# Patient Record
Sex: Female | Born: 1955 | Race: Black or African American | Hispanic: No | Marital: Married | Smoking: Never smoker
Health system: Southern US, Community
[De-identification: ages and names within clinical notes are randomized; demographics above are authoritative.]

## PROBLEM LIST (undated history)

## (undated) DIAGNOSIS — I10 Essential (primary) hypertension: Secondary | ICD-10-CM

## (undated) DIAGNOSIS — J189 Pneumonia, unspecified organism: Secondary | ICD-10-CM

## (undated) DIAGNOSIS — I639 Cerebral infarction, unspecified: Secondary | ICD-10-CM

## (undated) DIAGNOSIS — J45909 Unspecified asthma, uncomplicated: Secondary | ICD-10-CM

## (undated) DIAGNOSIS — C801 Malignant (primary) neoplasm, unspecified: Secondary | ICD-10-CM

## (undated) DIAGNOSIS — J42 Unspecified chronic bronchitis: Secondary | ICD-10-CM

## (undated) DIAGNOSIS — E119 Type 2 diabetes mellitus without complications: Secondary | ICD-10-CM

## (undated) DIAGNOSIS — B20 Human immunodeficiency virus [HIV] disease: Secondary | ICD-10-CM

## (undated) DIAGNOSIS — D649 Anemia, unspecified: Secondary | ICD-10-CM

## (undated) DIAGNOSIS — R413 Other amnesia: Secondary | ICD-10-CM

## (undated) DIAGNOSIS — M1990 Unspecified osteoarthritis, unspecified site: Secondary | ICD-10-CM

## (undated) DIAGNOSIS — A481 Legionnaires' disease: Secondary | ICD-10-CM

## (undated) DIAGNOSIS — J4489 Other specified chronic obstructive pulmonary disease: Secondary | ICD-10-CM

## (undated) DIAGNOSIS — Z21 Asymptomatic human immunodeficiency virus [HIV] infection status: Secondary | ICD-10-CM

## (undated) DIAGNOSIS — J449 Chronic obstructive pulmonary disease, unspecified: Secondary | ICD-10-CM

## (undated) HISTORY — PX: VAGINAL HYSTERECTOMY: SUR661

## (undated) HISTORY — PX: TONSILLECTOMY: SUR1361

## (undated) HISTORY — DX: Human immunodeficiency virus (HIV) disease: B20

## (undated) HISTORY — PX: CATARACT EXTRACTION W/ INTRAOCULAR LENS  IMPLANT, BILATERAL: SHX1307

## (undated) HISTORY — DX: Legionnaires' disease: A48.1

## (undated) HISTORY — PX: TUBAL LIGATION: SHX77

## (undated) HISTORY — PX: EYE SURGERY: SHX253

## (undated) HISTORY — DX: Asymptomatic human immunodeficiency virus (hiv) infection status: Z21

---

## 2010-09-17 DIAGNOSIS — G894 Chronic pain syndrome: Secondary | ICD-10-CM | POA: Insufficient documentation

## 2010-09-17 DIAGNOSIS — E119 Type 2 diabetes mellitus without complications: Secondary | ICD-10-CM | POA: Insufficient documentation

## 2010-10-15 DIAGNOSIS — M8580 Other specified disorders of bone density and structure, unspecified site: Secondary | ICD-10-CM | POA: Insufficient documentation

## 2010-10-15 DIAGNOSIS — E559 Vitamin D deficiency, unspecified: Secondary | ICD-10-CM | POA: Insufficient documentation

## 2010-10-30 DIAGNOSIS — D472 Monoclonal gammopathy: Secondary | ICD-10-CM | POA: Insufficient documentation

## 2010-11-24 DIAGNOSIS — M222X9 Patellofemoral disorders, unspecified knee: Secondary | ICD-10-CM | POA: Insufficient documentation

## 2011-05-05 DIAGNOSIS — Z0000 Encounter for general adult medical examination without abnormal findings: Secondary | ICD-10-CM | POA: Insufficient documentation

## 2011-08-16 DIAGNOSIS — M19049 Primary osteoarthritis, unspecified hand: Secondary | ICD-10-CM | POA: Insufficient documentation

## 2012-07-04 DIAGNOSIS — H209 Unspecified iridocyclitis: Secondary | ICD-10-CM | POA: Insufficient documentation

## 2013-07-09 ENCOUNTER — Encounter (HOSPITAL_COMMUNITY): Payer: Self-pay | Admitting: Emergency Medicine

## 2013-07-09 DIAGNOSIS — Z8673 Personal history of transient ischemic attack (TIA), and cerebral infarction without residual deficits: Secondary | ICD-10-CM | POA: Insufficient documentation

## 2013-07-09 DIAGNOSIS — Z8701 Personal history of pneumonia (recurrent): Secondary | ICD-10-CM | POA: Insufficient documentation

## 2013-07-09 DIAGNOSIS — N390 Urinary tract infection, site not specified: Secondary | ICD-10-CM | POA: Insufficient documentation

## 2013-07-09 DIAGNOSIS — E119 Type 2 diabetes mellitus without complications: Secondary | ICD-10-CM | POA: Insufficient documentation

## 2013-07-09 DIAGNOSIS — J45909 Unspecified asthma, uncomplicated: Secondary | ICD-10-CM | POA: Insufficient documentation

## 2013-07-09 DIAGNOSIS — I10 Essential (primary) hypertension: Secondary | ICD-10-CM | POA: Insufficient documentation

## 2013-07-09 LAB — CBC WITH DIFFERENTIAL/PLATELET
Basophils Absolute: 0 10*3/uL (ref 0.0–0.1)
Basophils Relative: 0 % (ref 0–1)
EOS PCT: 2 % (ref 0–5)
Eosinophils Absolute: 0.1 10*3/uL (ref 0.0–0.7)
HEMATOCRIT: 32.5 % — AB (ref 36.0–46.0)
HEMOGLOBIN: 10.5 g/dL — AB (ref 12.0–15.0)
LYMPHS ABS: 1.3 10*3/uL (ref 0.7–4.0)
Lymphocytes Relative: 31 % (ref 12–46)
MCH: 30.7 pg (ref 26.0–34.0)
MCHC: 32.3 g/dL (ref 30.0–36.0)
MCV: 95 fL (ref 78.0–100.0)
MONO ABS: 0.5 10*3/uL (ref 0.1–1.0)
MONOS PCT: 11 % (ref 3–12)
NEUTROS ABS: 2.3 10*3/uL (ref 1.7–7.7)
Neutrophils Relative %: 56 % (ref 43–77)
Platelets: 202 10*3/uL (ref 150–400)
RBC: 3.42 MIL/uL — AB (ref 3.87–5.11)
RDW: 14.1 % (ref 11.5–15.5)
WBC: 4.1 10*3/uL (ref 4.0–10.5)

## 2013-07-09 LAB — URINALYSIS, ROUTINE W REFLEX MICROSCOPIC
BILIRUBIN URINE: NEGATIVE
Glucose, UA: NEGATIVE mg/dL
Ketones, ur: NEGATIVE mg/dL
NITRITE: NEGATIVE
Protein, ur: 100 mg/dL — AB
SPECIFIC GRAVITY, URINE: 1.011 (ref 1.005–1.030)
Urobilinogen, UA: 0.2 mg/dL (ref 0.0–1.0)
pH: 6 (ref 5.0–8.0)

## 2013-07-09 LAB — COMPREHENSIVE METABOLIC PANEL
ALT: 43 U/L — ABNORMAL HIGH (ref 0–35)
AST: 90 U/L — ABNORMAL HIGH (ref 0–37)
Albumin: 3.3 g/dL — ABNORMAL LOW (ref 3.5–5.2)
Alkaline Phosphatase: 54 U/L (ref 39–117)
BILIRUBIN TOTAL: 0.5 mg/dL (ref 0.3–1.2)
BUN: 13 mg/dL (ref 6–23)
CALCIUM: 9 mg/dL (ref 8.4–10.5)
CHLORIDE: 97 meq/L (ref 96–112)
CO2: 21 meq/L (ref 19–32)
CREATININE: 1.14 mg/dL — AB (ref 0.50–1.10)
GFR calc non Af Amer: 52 mL/min — ABNORMAL LOW (ref >90)
GFR, EST AFRICAN AMERICAN: 60 mL/min — AB (ref >90)
GLUCOSE: 118 mg/dL — AB (ref 70–99)
Potassium: 3.7 mEq/L (ref 3.7–5.3)
Sodium: 134 mEq/L — ABNORMAL LOW (ref 137–147)
Total Protein: 8.8 g/dL — ABNORMAL HIGH (ref 6.0–8.3)

## 2013-07-09 LAB — URINE MICROSCOPIC-ADD ON

## 2013-07-09 LAB — CBG MONITORING, ED: GLUCOSE-CAPILLARY: 130 mg/dL — AB (ref 70–99)

## 2013-07-09 NOTE — ED Notes (Signed)
The pt had 1 500mg  tylenol 2000

## 2013-07-09 NOTE — ED Notes (Signed)
The pt is visiting her daughter from [place] she has been there a week.. Today the daughter thought that the pt felt too hot so she checked her temp.  Her temp was elevated.  The pt has no pain and no other complaints.  She was ok all day.  The daughter reports that it is too hot in her house

## 2013-07-10 ENCOUNTER — Emergency Department (HOSPITAL_COMMUNITY)
Admission: EM | Admit: 2013-07-10 | Discharge: 2013-07-10 | Disposition: A | Discharge status: Home / Self Care | Payer: Federal, State, Local not specified - PPO | Attending: Emergency Medicine | Admitting: Emergency Medicine

## 2013-07-10 DIAGNOSIS — N390 Urinary tract infection, site not specified: Secondary | ICD-10-CM

## 2013-07-10 DIAGNOSIS — R509 Fever, unspecified: Secondary | ICD-10-CM

## 2013-07-10 DIAGNOSIS — E119 Type 2 diabetes mellitus without complications: Secondary | ICD-10-CM

## 2013-07-10 HISTORY — DX: Pneumonia, unspecified organism: J18.9

## 2013-07-10 HISTORY — DX: Unspecified asthma, uncomplicated: J45.909

## 2013-07-10 HISTORY — DX: Cerebral infarction, unspecified: I63.9

## 2013-07-10 HISTORY — DX: Essential (primary) hypertension: I10

## 2013-07-10 MED ORDER — CEPHALEXIN 250 MG PO CAPS
500.0000 mg | ORAL_CAPSULE | Freq: Once | ORAL | Status: AC
  Administered 2013-07-10: 500 mg via ORAL
  Filled 2013-07-10: qty 2

## 2013-07-10 MED ORDER — CEPHALEXIN 500 MG PO CAPS: 500.0000 mg | ORAL_CAPSULE | Freq: Three times a day (TID) | ORAL | Status: DC

## 2013-07-10 NOTE — ED Provider Notes (Signed)
CSN: [id number]     Arrival date & time 07/09/13  2146 History   First MD Initiated Contact with Patient 07/10/13 0056     Chief Complaint  Patient presents with  . Fever     (Consider location/radiation/quality/duration/timing/severity/associated sxs/prior Treatment) HPI This patient is a pleasant 58 year old diabetic woman who is visiting her daughter. The patient developed a subjective fever this afternoon. Daughter felt that the patient had a tactile fever. Thus the patient was brought to the emergency department. The patient is feeling better at this time. She did not have any experience of infectious symptoms such as URI symptoms, GI symptoms or GU symptoms.  No recent antibiotics or hospitalizations. He reports that her by mouth intake has been normal.  Past Medical History  Diagnosis Date  . Diabetes mellitus without complication   . Hypertension   . Stroke   . Asthma   . Pneumonia    History reviewed. No pertinent past surgical history. No family history on file. History  Substance Use Topics  . Smoking status: Never Smoker   . Smokeless tobacco: Not on file  . Alcohol Use: No   OB History   Grav Para Term Preterm Abortions TAB SAB Ect Mult Living                 Review of Systems Ten point review of symptoms performed and is negative with the exception of symptoms noted above.     Allergies  Aspirin and Prednisone  Home Medications   Prior to Admission medications   Not on File   BP 105/68  Pulse 112  Temp(Src) 100.3 F (37.9 C) (Oral)  Resp 18  SpO2 98% Physical Exam Gen: well developed and well nourished appearing Head: NCAT Eyes: PERL, EOMI Nose: no epistaixis or rhinorrhea Mouth/throat: mucosa is moist and pink Neck: supple, no stridor Lungs: CTA B, no wheezing, rhonchi or rales CV: pulse 88, RRR, no murmur, extremities appear well perfused.  Abd: soft, notender, nondistended Back: no ttp, no cva ttp Skin: warm and dry Ext: normal to  inspection, no dependent edema Neuro: CN ii-xii grossly intact, no focal deficits Psyche; normal affect,  calm and cooperative.   ED Course  Procedures (including critical care time) Labs Review Labs Reviewed  CBC WITH DIFFERENTIAL - Abnormal; Notable for the following:    RBC 3.42 (*)    Hemoglobin 10.5 (*)    HCT 32.5 (*)    All other components within normal limits  COMPREHENSIVE METABOLIC PANEL - Abnormal; Notable for the following:    Sodium 134 (*)    Glucose, Bld 118 (*)    Creatinine, Ser 1.14 (*)    Total Protein 8.8 (*)    Albumin 3.3 (*)    AST 90 (*)    ALT 43 (*)    GFR calc non Af Amer 52 (*)    GFR calc Af Amer 60 (*)    All other components within normal limits  URINALYSIS, ROUTINE W REFLEX MICROSCOPIC - Abnormal; Notable for the following:    APPearance TURBID (*)    Hgb urine dipstick MODERATE (*)    Protein, ur 100 (*)    Leukocytes, UA LARGE (*)    All other components within normal limits  URINE MICROSCOPIC-ADD ON - Abnormal; Notable for the following:    Bacteria, UA MANY (*)    All other components within normal limits  CBG MONITORING, ED - Abnormal; Notable for the following:    Glucose-Capillary 130 (*)  All other components within normal limits  URINE CULTURE      MDM   DDX: UTI, pneumonia, early URI  Patient with simple UTI. She is nontoxic and well hydrated appearing. Stable for d/c with empiric abx. First dose in ED.     Elyn Peers, MD 07/10/13 [phone]

## 2013-07-11 LAB — URINE CULTURE

## 2013-07-13 ENCOUNTER — Telehealth (HOSPITAL_BASED_OUTPATIENT_CLINIC_OR_DEPARTMENT_OTHER): Payer: Self-pay | Admitting: Emergency Medicine

## 2013-07-13 NOTE — Telephone Encounter (Signed)
Post ED Visit - Positive Culture Follow-up  Culture report reviewed by antimicrobial stewardship pharmacist: []  Wes Aguada, [place].D., BCPS []  Heide Guile, Pharm.D., BCPS []  Alycia Rossetti, Pharm.D., BCPS []  [place], Pharm.D., BCPS, AAHIVP []  Legrand Como, Pharm.D., BCPS, AAHIVP []  Juliene Pina, Pharm.D. [x]  Eligah East, Pharm.D.  Positive urine culture Treated with Keflex, organism sensitive to the same and no further patient follow-up is required at this time.  [place], Rex Kras 07/13/2013, 6:12 PM

## 2013-09-08 ENCOUNTER — Inpatient Hospital Stay (HOSPITAL_COMMUNITY)
Admission: EM | Admit: 2013-09-08 | Discharge: 2013-09-09 | DRG: 192 | Disposition: A | Discharge status: Home / Self Care | Payer: Federal, State, Local not specified - PPO | Attending: Internal Medicine | Admitting: Internal Medicine

## 2013-09-08 ENCOUNTER — Emergency Department (HOSPITAL_COMMUNITY): Payer: Federal, State, Local not specified - PPO

## 2013-09-08 ENCOUNTER — Encounter (HOSPITAL_COMMUNITY): Payer: Self-pay | Admitting: Emergency Medicine

## 2013-09-08 DIAGNOSIS — Z79899 Other long term (current) drug therapy: Secondary | ICD-10-CM | POA: Diagnosis not present

## 2013-09-08 DIAGNOSIS — Z8673 Personal history of transient ischemic attack (TIA), and cerebral infarction without residual deficits: Secondary | ICD-10-CM

## 2013-09-08 DIAGNOSIS — J441 Chronic obstructive pulmonary disease with (acute) exacerbation: Principal | ICD-10-CM | POA: Diagnosis present

## 2013-09-08 DIAGNOSIS — M069 Rheumatoid arthritis, unspecified: Secondary | ICD-10-CM | POA: Diagnosis present

## 2013-09-08 DIAGNOSIS — J45901 Unspecified asthma with (acute) exacerbation: Secondary | ICD-10-CM | POA: Diagnosis present

## 2013-09-08 DIAGNOSIS — E119 Type 2 diabetes mellitus without complications: Secondary | ICD-10-CM | POA: Diagnosis present

## 2013-09-08 DIAGNOSIS — Z886 Allergy status to analgesic agent status: Secondary | ICD-10-CM | POA: Diagnosis not present

## 2013-09-08 DIAGNOSIS — J4541 Moderate persistent asthma with (acute) exacerbation: Secondary | ICD-10-CM

## 2013-09-08 DIAGNOSIS — Z888 Allergy status to other drugs, medicaments and biological substances status: Secondary | ICD-10-CM

## 2013-09-08 DIAGNOSIS — I10 Essential (primary) hypertension: Secondary | ICD-10-CM | POA: Diagnosis present

## 2013-09-08 HISTORY — DX: Anemia, unspecified: D64.9

## 2013-09-08 HISTORY — DX: Other amnesia: R41.3

## 2013-09-08 HISTORY — DX: Unspecified osteoarthritis, unspecified site: M19.90

## 2013-09-08 HISTORY — DX: Other specified chronic obstructive pulmonary disease: J44.89

## 2013-09-08 HISTORY — DX: Chronic obstructive pulmonary disease, unspecified: J44.9

## 2013-09-08 HISTORY — DX: Unspecified chronic bronchitis: J42

## 2013-09-08 HISTORY — DX: Type 2 diabetes mellitus without complications: E11.9

## 2013-09-08 LAB — BASIC METABOLIC PANEL
Anion gap: 12 (ref 5–15)
BUN: 15 mg/dL (ref 6–23)
CO2: 28 mEq/L (ref 19–32)
Calcium: 9.7 mg/dL (ref 8.4–10.5)
Chloride: 98 mEq/L (ref 96–112)
Creatinine, Ser: 0.76 mg/dL (ref 0.50–1.10)
GFR calc Af Amer: >90 mL/min (ref >90)
GFR calc non Af Amer: >90 mL/min (ref >90)
Glucose, Bld: 162 mg/dL — ABNORMAL HIGH (ref 70–99)
Potassium: 4.3 mEq/L (ref 3.7–5.3)
Sodium: 138 mEq/L (ref 137–147)

## 2013-09-08 LAB — CBC
HCT: 35.8 % — ABNORMAL LOW (ref 36.0–46.0)
Hemoglobin: 11.4 g/dL — ABNORMAL LOW (ref 12.0–15.0)
MCH: 30.6 pg (ref 26.0–34.0)
MCHC: 31.8 g/dL (ref 30.0–36.0)
MCV: 96.2 fL (ref 78.0–100.0)
Platelets: 156 10*3/uL (ref 150–400)
RBC: 3.72 MIL/uL — ABNORMAL LOW (ref 3.87–5.11)
RDW: 13.8 % (ref 11.5–15.5)
WBC: 5.1 10*3/uL (ref 4.0–10.5)

## 2013-09-08 LAB — GLUCOSE, CAPILLARY: Glucose-Capillary: 449 mg/dL — ABNORMAL HIGH (ref 70–99)

## 2013-09-08 LAB — D-DIMER, QUANTITATIVE: D-Dimer, Quant: 0.88 ug/mL-FEU — ABNORMAL HIGH (ref 0.00–0.48)

## 2013-09-08 LAB — GLUCOSE, RANDOM: Glucose, Bld: 434 mg/dL — ABNORMAL HIGH (ref 70–99)

## 2013-09-08 MED ORDER — GLIMEPIRIDE 4 MG PO TABS
4.0000 mg | ORAL_TABLET | Freq: Every day | ORAL | Status: DC
  Administered 2013-09-09: 4 mg via ORAL
  Filled 2013-09-08 (×2): qty 1

## 2013-09-08 MED ORDER — METHYLPREDNISOLONE SODIUM SUCC 125 MG IJ SOLR
125.0000 mg | Freq: Once | INTRAMUSCULAR | Status: AC
  Administered 2013-09-08: 125 mg via INTRAVENOUS
  Filled 2013-09-08: qty 2

## 2013-09-08 MED ORDER — LEVOFLOXACIN IN D5W 750 MG/150ML IV SOLN
750.0000 mg | INTRAVENOUS | Status: DC
  Administered 2013-09-08: 750 mg via INTRAVENOUS
  Filled 2013-09-08 (×2): qty 150

## 2013-09-08 MED ORDER — INSULIN ASPART 100 UNIT/ML ~~LOC~~ SOLN: 0.0000 [IU] | Freq: Every day | SUBCUTANEOUS | Status: DC

## 2013-09-08 MED ORDER — SODIUM CHLORIDE 0.9 % IV SOLN
INTRAVENOUS | Status: DC
  Administered 2013-09-08: 1000 mL via INTRAVENOUS

## 2013-09-08 MED ORDER — INSULIN ASPART 100 UNIT/ML ~~LOC~~ SOLN
15.0000 [IU] | Freq: Once | SUBCUTANEOUS | Status: AC
  Administered 2013-09-08: 15 [IU] via SUBCUTANEOUS

## 2013-09-08 MED ORDER — INSULIN ASPART 100 UNIT/ML ~~LOC~~ SOLN
0.0000 [IU] | Freq: Three times a day (TID) | SUBCUTANEOUS | Status: DC
  Administered 2013-09-09: 2 [IU] via SUBCUTANEOUS

## 2013-09-08 MED ORDER — VITAMIN D (ERGOCALCIFEROL) 1.25 MG (50000 UNIT) PO CAPS: 50000.0000 [IU] | ORAL_CAPSULE | ORAL | Status: DC

## 2013-09-08 MED ORDER — ALBUTEROL SULFATE (2.5 MG/3ML) 0.083% IN NEBU
2.5000 mg | INHALATION_SOLUTION | RESPIRATORY_TRACT | Status: DC | PRN
Start: 2013-09-08 — End: 2013-09-09

## 2013-09-08 MED ORDER — METHYLPREDNISOLONE SODIUM SUCC 125 MG IJ SOLR
60.0000 mg | Freq: Three times a day (TID) | INTRAMUSCULAR | Status: DC
  Administered 2013-09-08 – 2013-09-09 (×2): 60 mg via INTRAVENOUS
  Filled 2013-09-08 (×5): qty 0.96

## 2013-09-08 MED ORDER — ONDANSETRON HCL 4 MG PO TABS: 4.0000 mg | ORAL_TABLET | Freq: Four times a day (QID) | ORAL | Status: DC | PRN

## 2013-09-08 MED ORDER — ALBUTEROL (5 MG/ML) CONTINUOUS INHALATION SOLN
15.0000 mg/h | INHALATION_SOLUTION | RESPIRATORY_TRACT | Status: DC
  Administered 2013-09-08: 15 mg/h via RESPIRATORY_TRACT
  Filled 2013-09-08: qty 20

## 2013-09-08 MED ORDER — FOLIC ACID 1 MG PO TABS
1.0000 mg | ORAL_TABLET | Freq: Every day | ORAL | Status: DC
  Administered 2013-09-08 – 2013-09-09 (×2): 1 mg via ORAL
  Filled 2013-09-08 (×2): qty 1

## 2013-09-08 MED ORDER — IOHEXOL 350 MG/ML SOLN
100.0000 mL | Freq: Once | INTRAVENOUS | Status: AC | PRN
  Administered 2013-09-08: 100 mL via INTRAVENOUS

## 2013-09-08 MED ORDER — INSULIN ASPART 100 UNIT/ML ~~LOC~~ SOLN: 0.0000 [IU] | Freq: Three times a day (TID) | SUBCUTANEOUS | Status: DC

## 2013-09-08 MED ORDER — ACETAMINOPHEN 650 MG RE SUPP: 650.0000 mg | Freq: Four times a day (QID) | RECTAL | Status: DC | PRN

## 2013-09-08 MED ORDER — IPRATROPIUM BROMIDE 0.02 % IN SOLN
0.5000 mg | Freq: Once | RESPIRATORY_TRACT | Status: AC
  Administered 2013-09-08: 0.5 mg via RESPIRATORY_TRACT
  Filled 2013-09-08: qty 2.5

## 2013-09-08 MED ORDER — ACETAMINOPHEN 325 MG PO TABS: 650.0000 mg | ORAL_TABLET | Freq: Four times a day (QID) | ORAL | Status: DC | PRN

## 2013-09-08 MED ORDER — ENOXAPARIN SODIUM 40 MG/0.4ML ~~LOC~~ SOLN
40.0000 mg | SUBCUTANEOUS | Status: DC
  Administered 2013-09-08: 40 mg via SUBCUTANEOUS
  Filled 2013-09-08 (×2): qty 0.4

## 2013-09-08 MED ORDER — GUAIFENESIN-DM 100-10 MG/5ML PO SYRP
5.0000 mL | ORAL_SOLUTION | ORAL | Status: DC | PRN
Start: 2013-09-08 — End: 2013-09-09
  Filled 2013-09-08: qty 5

## 2013-09-08 MED ORDER — OXYCODONE HCL 5 MG PO TABS: 5.0000 mg | ORAL_TABLET | ORAL | Status: DC | PRN

## 2013-09-08 MED ORDER — ONDANSETRON HCL 4 MG/2ML IJ SOLN: 4.0000 mg | Freq: Four times a day (QID) | INTRAMUSCULAR | Status: DC | PRN

## 2013-09-08 MED ORDER — PANTOPRAZOLE SODIUM 40 MG PO TBEC: 40.0000 mg | DELAYED_RELEASE_TABLET | Freq: Every day | ORAL | Status: DC

## 2013-09-08 MED ORDER — IPRATROPIUM-ALBUTEROL 0.5-2.5 (3) MG/3ML IN SOLN
3.0000 mL | Freq: Four times a day (QID) | RESPIRATORY_TRACT | Status: DC
  Administered 2013-09-08: 3 mL via RESPIRATORY_TRACT
  Filled 2013-09-08: qty 3

## 2013-09-08 NOTE — ED Notes (Signed)
Pt. Woke with wheezing and cough and cold symptoms for a few weeks.  Dry cough.  Pt. Has hx of asthma and bronchitis and aerosol treatments this am not helping.    Pt. Is sob with exertion.

## 2013-09-08 NOTE — ED Notes (Signed)
Pt continues to be monitored by 12 lead, blood pressure, and pulse ox. Pts family remains at bedside.

## 2013-09-08 NOTE — H&P (Addendum)
PATIENT DETAILS Name: Bailey Moss Age: 58 y.o. Sex: female Date of Birth: 1955/03/27 Admit Date: 09/08/2013 PCP:Pcp Not In System   CHIEF COMPLAINT:  Shortness of breath for the past 2-3 days  HPI: Bailey Moss is a 58 y.o. female with a Past Medical History of asthma/chronic bronchitis, hypertension, diabetes, rheumatoid arthritis who presents today with the above noted complaint. Per patient, she lives in [place], [place] and is down here in [place] visiting her daughter. For the past 2 days she has had shortness of breath it has gradually progressed. She claims to have a cough that is mostly dry. She denies any any fever, chest pain. She also denies any nausea, vomiting or diarrhea it is a history of abdominal pain. Patient claims that her shortness of breath is identical to her usual asthmatic/bronchitis flares. Over the past day, shortness of breath has been particularly worse with exertion. She was brought to the emergency room and found to be wheezing, she was given Solu-Medrol, nebulized bronchodilators and felt somewhat better, however was still noted to be somewhat tachycardic and shortness of breath on ambulation. I was asked to admit this patient for further evaluation and treatment   ALLERGIES:   Allergies  Allergen Reactions  . Aspirin Other (See Comments)    Blood sugar goes up  . Prednisone Other (See Comments)    Blood sugar goes up    PAST MEDICAL HISTORY: Past Medical History  Diagnosis Date  . Diabetes mellitus without complication   . Hypertension   . Stroke   . Asthma   . Pneumonia     PAST SURGICAL HISTORY: History reviewed. No pertinent past surgical history.  MEDICATIONS AT HOME: Prior to Admission medications   Medication Sig Start Date End Date Taking? Authorizing Provider  albuterol (PROVENTIL) (2.5 MG/3ML) 0.083% nebulizer solution Take 2.5 mg by nebulization every 6 (six) hours as needed for wheezing or shortness of breath.    Yes Historical Provider, MD  Fluticasone-Salmeterol (ADVAIR) 500-50 MCG/DOSE AEPB Inhale 1 puff into the lungs 2 (two) times daily.   Yes Historical Provider, MD  folic acid (FOLVITE) 1 MG tablet Take 1 mg by mouth daily.   Yes Historical Provider, MD  gentamicin (GARAMYCIN) 0.3 % ophthalmic solution Place 1 drop into both eyes 2 (two) times daily.   Yes Historical Provider, MD  glimepiride (AMARYL) 4 MG tablet Take 4 mg by mouth daily with breakfast.   Yes Historical Provider, MD  metFORMIN (GLUCOPHAGE) 1000 MG tablet Take 1,000 mg by mouth 2 (two) times daily with a meal.   Yes Historical Provider, MD  methotrexate (RHEUMATREX) 2.5 MG tablet Take 7.5 mg by mouth once a week.   Yes Historical Provider, MD  Vitamin D, Ergocalciferol, (DRISDOL) 50000 UNITS CAPS capsule Take 50,000 Units by mouth 3 (three) times a week. On Monday, Wednesday and Friday.   Yes Historical Provider, MD    FAMILY HISTORY: Mother-congestive heart failure, and DVT  SOCIAL HISTORY:  reports that she has never smoked. She does not have any smokeless tobacco history on file. She reports that she does not drink alcohol. Her drug history is not on file.  REVIEW OF SYSTEMS:  Constitutional:   No  weight loss, night sweats,  Fevers, chills, fatigue.  HEENT:    No headaches, Difficulty swallowing,Tooth/dental problems,Sore throat,  No sneezing, itching, ear ache, nasal congestion, post nasal drip,   Cardio-vascular: No chest pain,  Orthopnea, PND, swelling in lower extremities, anasarca, dizziness, palpitations  GI:  No heartburn, indigestion, abdominal pain, nausea, vomiting, diarrhea, change in bowel habits, loss of appetite  Resp:  No excess mucus, no productive cough,  No coughing up of blood.No change in color of mucus.No wheezing.No chest wall deformity  Skin:  no rash or lesions.  GU:  no dysuria, change in color of urine, no urgency or frequency.  No flank pain.  Musculoskeletal: No joint pain or  swelling.  No decreased range of motion.  No back pain.  Psych: No change in mood or affect. No depression or anxiety.  No memory loss.   PHYSICAL EXAM: Blood pressure 121/54, pulse 121, temperature 98.9 F (37.2 C), temperature source Oral, resp. rate 18, SpO2 97.00%.  General appearance :Awake, alert, not in any distress. Speech Clear. Not toxic Looking HEENT: Atraumatic and Normocephalic, pupils equally reactive to light and accomodation Neck: supple, no JVD. No cervical lymphadenopathy.  Chest:Good air entry bilaterally, prolonged expiration with scattered rhonchi all over CVS: S1 S2 regular, no murmurs.  Abdomen: Bowel sounds present, Non tender and not distended with no gaurding, rigidity or rebound. Extremities: B/L Lower Ext shows no edema, both legs are warm to touch Neurology: Awake alert, and oriented X 3, CN II-XII intact, Non focal Skin:No Rash Wounds:N/A  LABS ON ADMISSION:   Recent Labs  09/08/13 1000  NA 138  K 4.3  CL 98  CO2 28  GLUCOSE 162*  BUN 15  CREATININE 0.76  CALCIUM 9.7   No results found for this basename: AST, ALT, ALKPHOS, BILITOT, PROT, ALBUMIN,  in the last 72 hours No results found for this basename: LIPASE, AMYLASE,  in the last 72 hours  Recent Labs  09/08/13 1000  WBC 5.1  HGB 11.4*  HCT 35.8*  MCV 96.2  PLT 156   No results found for this basename: CKTOTAL, CKMB, CKMBINDEX, TROPONINI,  in the last 72 hours  Recent Labs  09/08/13 1508  DDIMER 0.88*   No components found with this basename: POCBNP,    RADIOLOGIC STUDIES ON ADMISSION: Dg Chest 2 View  09/08/2013   CLINICAL DATA:  Shortness of breath.  Cough.  Asthma.  EXAM: CHEST  2 VIEW  COMPARISON:  None.  FINDINGS: The heart size and mediastinal contours are within normal limits. Both lungs are clear. The visualized skeletal structures are unremarkable.  IMPRESSION: No active cardiopulmonary disease.   Electronically Signed   By: Earle Gell M.D.   On: 09/08/2013 10:47     ASSESSMENT AND PLAN: Present on Admission:  . Asthma exacerbation - Will admit start on IV Solu-Medrol, nebulized bronchodilators and empiric Levaquin. Patient will be followed closely, if shows significant improvement, she could potentially be discharged the next 1-2 days. Currently she appears very comfortable, was seen by this M.D. ambulating in the room, good air entry on exam and not using any accessory muscles, easily speaking in full sentences. Her d-dimer was marginally elevated in the emergency room, do not suspect any pulmonary embolism at all. ED M.D. has already ordered a CT angiogram chest which will be followed.  Addendum-6 PM - CTA negative for pulmonary embolism, however shows extensive left axillary and left upper quadrant lymphadenopathy-discuss results with patient and daughter at bedside, I have explained to them that patient needs to go to her primary doctor [facility] [place] for further workup. They are aware, of potential for malignancy. Explained to them that this is incidental finding, and not likely related to her current presentation.  . DM (diabetes mellitus) -  Hold metformin, continue Amaryl, add SSI. Follow CBGs closely   . History of CVA - No major focal deficits on exam. Claims she had a CVA approximately 4 months ago.  . Rheumatoid arthritis - Hold methotrexate while inpatient, resume on discharge. Currently this is stable.  Further plan will depend as patient's clinical course evolves and further radiologic and laboratory data become available. Patient will be monitored closely.  Above noted plan was discussed with patien/daughter, they were in agreement.   DVT Prophylaxis: Prophylactic Lovenox   Code Status: Full Code  Total time spent for admission equals 45 minutes.  [facility] Pager [phone]  If 7PM-7AM, please contact night-coverage [email or web address] Password [email or web address] 09/08/2013, 5:25 PM  **Disclaimer: This  note may have been dictated with voice recognition software. Similar sounding words can inadvertently be transcribed and this note may contain transcription errors which may not have been corrected upon publication of note.**

## 2013-09-08 NOTE — ED Provider Notes (Signed)
Medical screening examination/treatment/procedure(s) were conducted as a shared visit with non-physician practitioner(s) and myself.  I personally evaluated the patient during the encounter.   EKG Interpretation None      Elevated d dimer. Will obtain CT. If negative will likely admit for asthma exacerbation given ongoing SOB  Dg Chest 2 View  09/08/2013   CLINICAL DATA:  Shortness of breath.  Cough.  Asthma.  EXAM: CHEST  2 VIEW  COMPARISON:  None.  FINDINGS: The heart size and mediastinal contours are within normal limits. Both lungs are clear. The visualized skeletal structures are unremarkable.  IMPRESSION: No active cardiopulmonary disease.   Electronically Signed   By: Earle Gell M.D.   On: 09/08/2013 10:47  I personally reviewed the imaging tests through PACS system I reviewed available ER/hospitalization records through the [facility], MD 09/08/13 2215

## 2013-09-08 NOTE — ED Notes (Signed)
Pt remains monitored by 12 lead, blood pressure, and pulse ox. Pts family remains at bedside.

## 2013-09-08 NOTE — ED Provider Notes (Signed)
CSN: [id number]     Arrival date & time 09/08/13  0944 History   First MD Initiated Contact with Patient 09/08/13 [phone]     Chief Complaint  Patient presents with  . Wheezing     (Consider location/radiation/quality/duration/timing/severity/associated sxs/prior Treatment) HPI Patient presents to the emergency department with wheezing, cough, and shortness of breath.  The patient, states, that she had a cough, over the last week, and states, that she's had a history of asthma and bronchitis.  The patient, states, that her nebulized treatments are not helping at home.  Patient is having increasing shortness of breath with exertion.  Patient denies chest pain, weakness, dizziness, blurred vision, headache, back pain, neck pain, fever, runny nose, sore throat, dysuria, altered mental status rash or syncope.  The patient, states, that nothing seems make her condition, better.  Patient, states, that she didn't take any other medications other than her prescribed medications Past Medical History  Diagnosis Date  . Diabetes mellitus without complication   . Hypertension   . Stroke   . Asthma   . Pneumonia    History reviewed. No pertinent past surgical history. No family history on file. History  Substance Use Topics  . Smoking status: Never Smoker   . Smokeless tobacco: Not on file  . Alcohol Use: No   OB History   Grav Para Term Preterm Abortions TAB SAB Ect Mult Living                 Review of Systems  All other systems negative except as documented in the HPI. All pertinent positives and negatives as reviewed in the HPI.  Allergies  Aspirin and Prednisone  Home Medications   Prior to Admission medications   Medication Sig Start Date End Date Taking? Authorizing Provider  albuterol (PROVENTIL) (2.5 MG/3ML) 0.083% nebulizer solution Take 2.5 mg by nebulization every 6 (six) hours as needed for wheezing or shortness of breath.   Yes Historical Provider, MD   Fluticasone-Salmeterol (ADVAIR) 500-50 MCG/DOSE AEPB Inhale 1 puff into the lungs 2 (two) times daily.   Yes Historical Provider, MD  folic acid (FOLVITE) 1 MG tablet Take 1 mg by mouth daily.   Yes Historical Provider, MD  gentamicin (GARAMYCIN) 0.3 % ophthalmic solution Place 1 drop into both eyes 2 (two) times daily.   Yes Historical Provider, MD  glimepiride (AMARYL) 4 MG tablet Take 4 mg by mouth daily with breakfast.   Yes Historical Provider, MD  metFORMIN (GLUCOPHAGE) 1000 MG tablet Take 1,000 mg by mouth 2 (two) times daily with a meal.   Yes Historical Provider, MD  methotrexate (RHEUMATREX) 2.5 MG tablet Take 7.5 mg by mouth once a week.   Yes Historical Provider, MD  Vitamin D, Ergocalciferol, (DRISDOL) 50000 UNITS CAPS capsule Take 50,000 Units by mouth 3 (three) times a week. On Monday, Wednesday and Friday.   Yes Historical Provider, MD   BP 111/58  Pulse 114  Temp(Src) 98.9 F (37.2 C) (Oral)  Resp 15  SpO2 96% Physical Exam  Nursing note and vitals reviewed. Constitutional: She appears well-developed and well-nourished. No distress.  HENT:  Head: Normocephalic and atraumatic.  Mouth/Throat: Oropharynx is clear and moist.  Eyes: Pupils are equal, round, and reactive to light.  Neck: Normal range of motion. Neck supple.  Cardiovascular: Normal rate, regular rhythm and normal heart sounds.  Exam reveals no gallop and no friction rub.   No murmur heard. Pulmonary/Chest: Effort normal and breath sounds normal. No respiratory distress.  Skin: Skin is warm and dry. No rash noted. No erythema.    ED Course  Procedures (including critical care time) Labs Review Labs Reviewed  CBC - Abnormal; Notable for the following:    RBC 3.72 (*)    Hemoglobin 11.4 (*)    HCT 35.8 (*)    All other components within normal limits  BASIC METABOLIC PANEL - Abnormal; Notable for the following:    Glucose, Bld 162 (*)    All other components within normal limits  D-DIMER, QUANTITATIVE  - Abnormal; Notable for the following:    D-Dimer, Quant 0.88 (*)    All other components within normal limits    Imaging Review Dg Chest 2 View  09/08/2013   CLINICAL DATA:  Shortness of breath.  Cough.  Asthma.  EXAM: CHEST  2 VIEW  COMPARISON:  None.  FINDINGS: The heart size and mediastinal contours are within normal limits. Both lungs are clear. The visualized skeletal structures are unremarkable.  IMPRESSION: No active cardiopulmonary disease.   Electronically Signed   By: Earle Gell M.D.   On: 09/08/2013 10:47   The patient had wheezing and tight breath sounds initially was given an hour-long treatment and observed here in the emergency department.  She was ambulated and her pulse rate went up into the 120s and she felt weak.  The patient at that point was advised the need to draw a d-dimer to further assess her wheezing.  She does feel better as far as her breathing is concerned   Patient's d-dimer is elevated, and will order a CT of her chest.    Brent [facility], PA-C 09/08/13 [phone]

## 2013-09-08 NOTE — Progress Notes (Signed)
NURSING PROGRESS NOTE  Phylicia Mcgaugh [id number] Admission Data: 09/08/2013 6:31 PM Attending Provider: Jonetta Osgood, MD PCP:Pcp Not In System Code Status:full  Ziomara Birenbaum is a 58 y.o. female patient admitted from ED:  -No acute distress noted.  -No complaints of shortness of breath.  -No complaints of chest pain.     Blood pressure 117/73, pulse 112, temperature 97.9 F (36.6 C), temperature source Oral, resp. rate 18, height 5\' 4"  (1.626 m), weight 69.491 kg (153 lb 3.2 oz), SpO2 97.00%.   IV Fluids:  IV in place, occlusive dsg intact without redness, IV cath antecubital right, condition patent and no redness normal saline @ 75.   Allergies:  Aspirin and Prednisone  Past Medical History:   has a past medical history of Diabetes mellitus without complication; Hypertension; Stroke; Asthma; and Pneumonia.  Past Surgical History:   has no past surgical history on file.  Social History:   reports that she has never smoked. She does not have any smokeless tobacco history on file. She reports that she does not drink alcohol.  Skin: warm dry intact   Patient/Family orientated to room. Information packet given to patient/family. Admission inpatient armband information verified with patient/family to include name and date of birth and placed on patient arm. Side rails up x 2, fall assessment and education completed with patient/family. Patient/family able to verbalize understanding of risk associated with falls and verbalized understanding to call for assistance before getting out of bed. Call light within reach. Patient/family able to voice and demonstrate understanding of unit orientation instructions.    Will continue to evaluate and treat per MD orders.

## 2013-09-08 NOTE — ED Notes (Signed)
Brought pt back to room via wheelchair with family in tow; pt given a gown to place on; family at bedside; Cecille Rubin, RN and [place], [place] present in room

## 2013-09-08 NOTE — Progress Notes (Signed)
RN called for report.  

## 2013-09-08 NOTE — ED Notes (Signed)
Pt. Ambulated in the hallway, very weak, oxygen sats, 92-93%

## 2013-09-09 LAB — CBC
HCT: 30.7 % — ABNORMAL LOW (ref 36.0–46.0)
HEMOGLOBIN: 10.2 g/dL — AB (ref 12.0–15.0)
MCH: 30.5 pg (ref 26.0–34.0)
MCHC: 33.2 g/dL (ref 30.0–36.0)
MCV: 91.9 fL (ref 78.0–100.0)
Platelets: 166 10*3/uL (ref 150–400)
RBC: 3.34 MIL/uL — AB (ref 3.87–5.11)
RDW: 13.6 % (ref 11.5–15.5)
WBC: 8.4 10*3/uL (ref 4.0–10.5)

## 2013-09-09 LAB — BASIC METABOLIC PANEL
Anion gap: 15 (ref 5–15)
BUN: 24 mg/dL — ABNORMAL HIGH (ref 6–23)
CHLORIDE: 102 meq/L (ref 96–112)
CO2: 22 mEq/L (ref 19–32)
Calcium: 9.7 mg/dL (ref 8.4–10.5)
Creatinine, Ser: 0.74 mg/dL (ref 0.50–1.10)
GFR calc Af Amer: >90 mL/min (ref >90)
GFR calc non Af Amer: >90 mL/min (ref >90)
GLUCOSE: 186 mg/dL — AB (ref 70–99)
POTASSIUM: 4 meq/L (ref 3.7–5.3)
Sodium: 139 mEq/L (ref 137–147)

## 2013-09-09 LAB — GLUCOSE, CAPILLARY
GLUCOSE-CAPILLARY: 230 mg/dL — AB (ref 70–99)
Glucose-Capillary: 195 mg/dL — ABNORMAL HIGH (ref 70–99)
Glucose-Capillary: 385 mg/dL — ABNORMAL HIGH (ref 70–99)

## 2013-09-09 MED ORDER — INSULIN GLARGINE 100 UNIT/ML ~~LOC~~ SOLN
15.0000 [IU] | Freq: Every day | SUBCUTANEOUS | Status: DC
  Administered 2013-09-09: 15 [IU] via SUBCUTANEOUS
  Filled 2013-09-09: qty 0.15

## 2013-09-09 MED ORDER — IPRATROPIUM-ALBUTEROL 0.5-2.5 (3) MG/3ML IN SOLN
3.0000 mL | Freq: Four times a day (QID) | RESPIRATORY_TRACT | Status: DC | PRN
  Administered 2013-09-09: 3 mL via RESPIRATORY_TRACT
  Filled 2013-09-09: qty 3

## 2013-09-09 MED ORDER — METHYLPREDNISOLONE SODIUM SUCC 40 MG IJ SOLR
40.0000 mg | Freq: Two times a day (BID) | INTRAMUSCULAR | Status: DC
  Filled 2013-09-09 (×2): qty 1

## 2013-09-09 MED ORDER — GLUCOSE BLOOD VI STRP: ORAL_STRIP | Status: DC

## 2013-09-09 MED ORDER — LEVOFLOXACIN 750 MG PO TABS: 750.0000 mg | ORAL_TABLET | Freq: Every day | ORAL | Status: DC

## 2013-09-09 MED ORDER — ALBUTEROL SULFATE HFA 108 (90 BASE) MCG/ACT IN AERS: 2.0000 | INHALATION_SPRAY | Freq: Four times a day (QID) | RESPIRATORY_TRACT | Status: DC | PRN

## 2013-09-09 MED ORDER — PREDNISONE 10 MG PO TABS: ORAL_TABLET | ORAL | Status: DC

## 2013-09-09 MED ORDER — FLUTICASONE-SALMETEROL 500-50 MCG/DOSE IN AEPB: 1.0000 | INHALATION_SPRAY | Freq: Two times a day (BID) | RESPIRATORY_TRACT | Status: DC

## 2013-09-09 MED ORDER — METFORMIN HCL 1000 MG PO TABS: 1000.0000 mg | ORAL_TABLET | Freq: Two times a day (BID) | ORAL | Status: DC

## 2013-09-09 MED ORDER — AUTO-LANCET MISC: Status: DC

## 2013-09-09 MED ORDER — FREESTYLE SYSTEM KIT: 1.0000 | PACK | Status: DC | PRN

## 2013-09-09 NOTE — Discharge Instructions (Signed)
Follow with Primary MD  Honor Junes  and other consultant as instructed your Hospitalist MD  Please get a complete blood count and chemistry panel checked by your Primary MD at your next visit, and again as instructed by your Primary MD.  You have enlarged lymph nodes in your left axilla and left upper abdomen -this could be cancer, please followup with her primary care practitioner as soon as possible. Please take the official report of the CT scan of the chest, and CD with CT images to her PCP at her next appointment  Get Medicines reviewed and adjusted. Please take all your medications with you for your next visit with your Primary MD  Please request your Primary MD to go over all hospital tests and procedure/radiological results at the follow up, please ask your Primary MD to get all Hospital records sent to his/her office.  If you experience worsening of your admission symptoms, develop shortness of breath, life threatening emergency, suicidal or homicidal thoughts you must seek medical attention immediately by calling 911 or calling your MD immediately  if symptoms less severe.  You must read complete instructions/literature along with all the possible adverse reactions/side effects for all the Medicines you take and that have been prescribed to you. Take any new Medicines after you have completely understood and accpet all the possible adverse reactions/side effects.   Do not drive when taking Pain medications.   Do not take more than prescribed Pain, Sleep and Anxiety Medications  Special Instructions: If you have smoked or chewed Tobacco  in the last 2 yrs please stop smoking, stop any regular Alcohol  and or any Recreational drug use.  Wear Seat belts while driving.  Please note  You were cared for by a hospitalist during your hospital stay. Once you are discharged, your primary care physician will handle any further medical issues. Please note that NO REFILLS for any  discharge medications will be authorized once you are discharged, as it is imperative that you return to your primary care physician (or establish a relationship with a primary care physician if you do not have one) for your aftercare needs so that they can reassess your need for medications and monitor your lab values.

## 2013-09-09 NOTE — Progress Notes (Signed)
NURSING PROGRESS NOTE  Bailey Moss [id number] Discharge Data: 09/09/2013 10:59 AM Attending Provider: Jonetta Osgood, MD ZHY:[id number] A Schleupner     [name] to be D/C'd Home per MD order.  Discussed with the patient the After Visit Summary and all questions fully answered. All IV's discontinued with no bleeding noted. All belongings returned to patient for patient to take home.   Last Vital Signs:  Blood pressure 116/68, pulse 88, temperature 97.8 F (36.6 C), temperature source Oral, resp. rate 18, height 5\' 4"  (1.626 m), weight 69.491 kg (153 lb 3.2 oz), SpO2 98.00%.  Discharge Medication List   Medication List         albuterol (2.5 MG/3ML) 0.083% nebulizer solution  Commonly known as:  PROVENTIL  Take 2.5 mg by nebulization every 6 (six) hours as needed for wheezing or shortness of breath.     albuterol 108 (90 BASE) MCG/ACT inhaler  Commonly known as:  PROVENTIL HFA;VENTOLIN HFA  Inhale 2 puffs into the lungs every 6 (six) hours as needed for wheezing or shortness of breath.     Fluticasone-Salmeterol 500-50 MCG/DOSE Aepb  Commonly known as:  ADVAIR  Inhale 1 puff into the lungs 2 (two) times daily.     folic acid 1 MG tablet  Commonly known as:  FOLVITE  Take 1 mg by mouth daily.     gentamicin 0.3 % ophthalmic solution  Commonly known as:  GARAMYCIN  Place 1 drop into both eyes 2 (two) times daily.     glimepiride 4 MG tablet  Commonly known as:  AMARYL  Take 4 mg by mouth daily with breakfast.     levofloxacin 750 MG tablet  Commonly known as:  LEVAQUIN  Take 1 tablet (750 mg total) by mouth daily.     metFORMIN 1000 MG tablet  Commonly known as:  GLUCOPHAGE  Take 1,000 mg by mouth 2 (two) times daily with a meal.     methotrexate 2.5 MG tablet  Commonly known as:  RHEUMATREX  Take 7.5 mg by mouth once a week.     predniSONE 10 MG tablet  Commonly known as:  DELTASONE  - Take 4 tablets (40 mg) daily for 2 days, then,  - Take 3 tablets (30  mg) daily for 2 days, then,  - Take 2 tablets (20 mg) daily for 2 days, then,  - Take 1 tablets (10 mg) daily for 1 days, then stop     Vitamin D (Ergocalciferol) 50000 UNITS Caps capsule  Commonly known as:  DRISDOL  Take 50,000 Units by mouth 3 (three) times a week. On Monday, Wednesday and Friday.

## 2013-09-09 NOTE — Discharge Summary (Addendum)
PATIENT DETAILS Name: Bailey Moss Age: 58 y.o. Sex: female Date of Birth: 1955/03/30 MRN: [id number]. Admit Date: 09/08/2013 Admitting Physician: Jonetta Osgood, MD YIA:XKPV A Schleupner  Recommendations for Outpatient Follow-up:  1. Needs a workup for left axillary and left upper quadrant lymphadenopathy-suspicion for malignancy   PRIMARY DISCHARGE DIAGNOSIS:  Principal Problem:   Asthma exacerbation Active Problems:   DM (diabetes mellitus)   H/O: CVA (cerebrovascular accident)   Rheumatoid arthritis      PAST MEDICAL HISTORY: Past Medical History  Diagnosis Date  . Hypertension   . Type II diabetes mellitus   . Chronic bronchitis     "get it q yr" (09/08/2013)  . Pneumonia 2013; 2014; 2015  . Asthma   . Asthmatic bronchitis , chronic   . Anemia   . Stroke "~ 04/2013    "memory problems; weak all over since" (09/08/2013)  . Arthritis     "all my body"  . Memory problem     DISCHARGE MEDICATIONS:   Medication List         albuterol (2.5 MG/3ML) 0.083% nebulizer solution  Commonly known as:  PROVENTIL  Take 2.5 mg by nebulization every 6 (six) hours as needed for wheezing or shortness of breath.     albuterol 108 (90 BASE) MCG/ACT inhaler  Commonly known as:  PROVENTIL HFA;VENTOLIN HFA  Inhale 2 puffs into the lungs every 6 (six) hours as needed for wheezing or shortness of breath.     Fluticasone-Salmeterol 500-50 MCG/DOSE Aepb  Commonly known as:  ADVAIR  Inhale 1 puff into the lungs 2 (two) times daily.     folic acid 1 MG tablet  Commonly known as:  FOLVITE  Take 1 mg by mouth daily.     gentamicin 0.3 % ophthalmic solution  Commonly known as:  GARAMYCIN  Place 1 drop into both eyes 2 (two) times daily.     glimepiride 4 MG tablet  Commonly known as:  AMARYL  Take 4 mg by mouth daily with breakfast.     levofloxacin 750 MG tablet  Commonly known as:  LEVAQUIN  Take 1 tablet (750 mg total) by mouth daily.     metFORMIN 1000 MG tablet    Commonly known as:  GLUCOPHAGE  Take 1,000 mg by mouth 2 (two) times daily with a meal.     methotrexate 2.5 MG tablet  Commonly known as:  RHEUMATREX  Take 7.5 mg by mouth once a week.     predniSONE 10 MG tablet  Commonly known as:  DELTASONE  - Take 4 tablets (40 mg) daily for 2 days, then,  - Take 3 tablets (30 mg) daily for 2 days, then,  - Take 2 tablets (20 mg) daily for 2 days, then,  - Take 1 tablets (10 mg) daily for 1 days, then stop     Vitamin D (Ergocalciferol) 50000 UNITS Caps capsule  Commonly known as:  DRISDOL  Take 50,000 Units by mouth 3 (three) times a week. On Monday, Wednesday and Friday.        ALLERGIES:   Allergies  Allergen Reactions  . Aspirin Other (See Comments)    Blood sugar goes up  . Prednisone Other (See Comments)    Blood sugar goes up    BRIEF HPI:  See H&P, Labs, Consult and Test reports for all details in brief, patient is a 58 year old female with a history of diabetes, hypertension, asthma, rheumatoid arthritis who presented with shortness of breath. She was admitted  for further evaluation and treatment  CONSULTATIONS:   None  PERTINENT RADIOLOGIC STUDIES: Dg Chest 2 View  09/08/2013   CLINICAL DATA:  Shortness of breath.  Cough.  Asthma.  EXAM: CHEST  2 VIEW  COMPARISON:  None.  FINDINGS: The heart size and mediastinal contours are within normal limits. Both lungs are clear. The visualized skeletal structures are unremarkable.  IMPRESSION: No active cardiopulmonary disease.   Electronically Signed   By: Earle Gell M.D.   On: 09/08/2013 10:47   Ct Angio Chest Pe W/cm &/or Wo Cm  09/08/2013   CLINICAL DATA:  Shortness of breath, tachycardia, history hypertension, diabetes, asthma, stroke  EXAM: CT ANGIOGRAPHY CHEST WITH CONTRAST  TECHNIQUE: Multidetector CT imaging of the chest was performed using the standard protocol during bolus administration of intravenous contrast. Multiplanar CT image reconstructions and MIPs were obtained  to evaluate the vascular anatomy.  CONTRAST:  126mL OMNIPAQUE IOHEXOL 350 MG/ML SOLN IV  COMPARISON:  None  FINDINGS: Aorta normal caliber without aneurysm or dissection.  Pulmonary arteries well opacified and patent.  No evidence of pulmonary embolism.  Single upper normal size lymph node at RIGHT cardiophrenic angle.  In the LEFT upper quadrant, a soft tissue mass 3.5 x 3.5 cm diameter is seen medial to the spleen and posterior to the stomach, question splenule, adrenal mass or adenopathy.  Second nodular density questioned in LEFT upper quadrant is seen on the last image number 84, approximately 2.7 x 1.4 cm.  Extensive LEFT axillary adenopathy, with nodes measuring up to 2.7 cm and 2.8 cm short axis.  No mediastinal, hilar, or RIGHT axillary adenopathy.  Infracervical region partially obscured by beam hardening artifacts from jewelry.  Dependent atelectasis in both lower lobes.  No pulmonary infiltrate, pleural effusion, pneumothorax, or acute osseous findings.  Review of the MIP images confirms the above findings.  IMPRESSION: No evidence of pulmonary embolism.  Bibasilar atelectasis.  Extensive LEFT axillary adenopathy with suspect additional enlarged lymph nodes in the LEFT upper quadrant, raising question of lymphoma or metastatic disease.  Findings called to Dalia Heading PA and to Dr. Sloan Leiter on 09/08/2013 at 1730 hr.   Electronically Signed   By: Lavonia Dana M.D.   On: 09/08/2013 17:33     PERTINENT LAB RESULTS: CBC:  Recent Labs  09/08/13 1000 09/09/13 0530  WBC 5.1 8.4  HGB 11.4* 10.2*  HCT 35.8* 30.7*  PLT 156 166   CMET CMP     Component Value Date/Time   NA 139 09/09/2013 0530   K 4.0 09/09/2013 0530   CL 102 09/09/2013 0530   CO2 22 09/09/2013 0530   GLUCOSE 186* 09/09/2013 0530   BUN 24* 09/09/2013 0530   CREATININE 0.74 09/09/2013 0530   CALCIUM 9.7 09/09/2013 0530   PROT 8.8* 07/09/2013 2200   ALBUMIN 3.3* 07/09/2013 2200   AST 90* 07/09/2013 2200   ALT 43* 07/09/2013 2200    ALKPHOS 54 07/09/2013 2200   BILITOT 0.5 07/09/2013 2200   GFRNONAA >90 09/09/2013 0530   GFRAA >90 09/09/2013 0530    GFR Estimated Creatinine Clearance: 72.4 ml/min (by C-G formula based on Cr of 0.74). No results found for this basename: LIPASE, AMYLASE,  in the last 72 hours No results found for this basename: CKTOTAL, CKMB, CKMBINDEX, TROPONINI,  in the last 72 hours No components found with this basename: POCBNP,   Recent Labs  09/08/13 1508  DDIMER 0.88*   No results found for this basename: HGBA1C,  in the last  72 hours No results found for this basename: CHOL, HDL, LDLCALC, TRIG, CHOLHDL, LDLDIRECT,  in the last 72 hours No results found for this basename: TSH, T4TOTAL, FREET3, T3FREE, THYROIDAB,  in the last 72 hours No results found for this basename: VITAMINB12, FOLATE, FERRITIN, TIBC, IRON, RETICCTPCT,  in the last 72 hours Coags: No results found for this basename: PT, INR,  in the last 72 hours Microbiology: No results found for this or any previous visit (from the past 240 hour(s)).   BRIEF HOSPITAL COURSE:   Principal Problem:   Asthma exacerbation - Admitted, started on IV Solu-Medrol, IV Levaquin, nebulized bronchodilators. Made rapid improvement overnight, this a.m. she feels she is back to her baseline. Lungs are completely clear on exam. She will be discharged today in a stable condition, she was placed on prednisone (tapering dose) for 7 more days, continue with Levaquin for a few more days. She will continue with Advair and nebulized bronchodilators. She has been asked to follow up with a family care practitioner in the next few days  Active Problems: Left axillary/breast upper abdominal lymphadenopathy - On initial evaluation in the emergency room, d-dimer was elevated, that led to a CT angiogram of the chest with demonstrated above. Please see CT chest report for further details. Patient is currently visiting [place], all her physicians are in [place],  [place]. She would need further outpatient workup of this lymphadenopathy including biopsy, patient and daughter are aware that there is risk of malignancy and that patient definitely needs further workup done in the near future. Official CT report and CT images in a CD have been provided to the patient to take to her primary care doctor at her next scheduled appointment.    DM (diabetes mellitus) - CBGs were elevated-but patient is on steroids. She'll be tapered off rapidly in the next one week. She will continue her usual diabetes medications. She has been asked to resume metformin on 8/16, she had contrast on 8/14.    H/O: CVA (cerebrovascular accident)  - Stable.    Rheumatoid arthritis - Resume methotrexate on discharge.  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   Objective:   Blood pressure 116/68, pulse 88, temperature 97.8 F (36.6 C), temperature source Oral, resp. rate 18, height 5\' 4"  (1.626 m), weight 69.491 kg (153 lb 3.2 oz), SpO2 98.00%.  Intake/Output Summary (Last 24 hours) at 09/09/13 1058 Last data filed at 09/09/13 0724  Gross per 24 hour  Intake 1128.75 ml  Output      1 ml  Net 1127.75 ml   Filed Weights   09/08/13 1826  Weight: 69.491 kg (153 lb 3.2 oz)    Exam Awake Alert, Oriented *3, No new F.N deficits, Normal affect [place].AT,PERRAL Supple Neck,No JVD, No cervical lymphadenopathy appriciated.  Symmetrical Chest wall movement, Good air movement bilaterally, CTAB RRR,No Gallops,Rubs or new Murmurs, No Parasternal Heave +ve B.Sounds, Abd Soft, Non tender, No organomegaly appriciated, No rebound -guarding or rigidity. No Cyanosis, Clubbing or edema, No new Rash or bruise  DISCHARGE CONDITION: Stable  DISPOSITION: Home  DISCHARGE INSTRUCTIONS:    Activity:  As tolerated  Follow with Primary MD  Jeannette How  Schleupner  and other consultant as instructed your Hospitalist MD  Please get a complete blood count and chemistry panel checked by your Primary MD at your next visit, and again as instructed  by your Primary MD.  You have enlarged lymph nodes in your left axilla and left upper abdomen -this could be cancer, please followup with her primary care practitioner as soon as possible. Please take the official report of the CT scan of the chest, and CD with CT images to her PCP at her next appointment  Get Medicines reviewed and adjusted. Please take all your medications with you for your next visit with your Primary MD  Please request your Primary MD to go over all hospital tests and procedure/radiological results at the follow up, please ask your Primary MD to get all Hospital records sent to his/her office.  If you experience worsening of your admission symptoms, develop shortness of breath, life threatening emergency, suicidal or homicidal thoughts you must seek medical attention immediately by calling 911 or calling your MD immediately  if symptoms less severe.  You must read complete instructions/literature along with all the possible adverse reactions/side effects for all the Medicines you take and that have been prescribed to you. Take any new Medicines after you have completely understood and accpet all the possible adverse reactions/side effects.   Do not drive when taking Pain medications.   Do not take more than prescribed Pain, Sleep and Anxiety Medications  Special Instructions: If you have smoked or chewed Tobacco  in the last 2 yrs please stop smoking, stop any regular Alcohol  and or any Recreational drug use.  Wear Seat belts while driving.  Please note  You were cared for by a hospitalist during your hospital stay. Once you are discharged, your primary care physician will handle any further medical issues. Please note that NO REFILLS for any discharge medications will be  authorized once you are discharged, as it is imperative that you return to your primary care physician (or establish a relationship with a primary care physician if you do not have one) for your aftercare needs so that they can reassess your need for medications and monitor your lab values.  Diet recommendation: Diabetic Diet Heart Healthy diet      Discharge Instructions   Call MD for:  difficulty breathing, headache or visual disturbances    Complete by:  As directed      Diet - low sodium heart healthy    Complete by:  As directed      Diet Carb Modified    Complete by:  As directed      Increase activity slowly    Complete by:  As directed            Follow-up Information   Follow up with Jeannette How Schleupner. Schedule an appointment as soon as possible for a visit in 5 days. (PLEASE SHOW YOUR PCP-OFFICIAL RESULT OF CT CHEST-YOU NEED FURTHER WORK UP. )    Specialty:  Internal Medicine   Contact information:   [place] [id number] [phone]       Total Time spent on discharge equals 45 minutes.  SignedOren Binet 09/09/2013 10:58 AM  **Disclaimer: This note may have been dictated with voice recognition software. Similar sounding words can inadvertently be transcribed and this note may contain transcription errors which may not have been corrected upon publication of note.**

## 2013-09-09 NOTE — Progress Notes (Signed)
Pt CBG was 449 and asymptomatic. MD paged and STAT lab verification ordered. When MD returned page, MD gave verbal order to change sliding scale order to moderate coverage which included HS coverage. Also, gave verbal order for 15 units of novolog. Follow up CBG was 385. CBG this AM was 195.

## 2013-10-22 DIAGNOSIS — C8580 Other specified types of non-Hodgkin lymphoma, unspecified site: Secondary | ICD-10-CM | POA: Insufficient documentation

## 2013-12-22 ENCOUNTER — Encounter (HOSPITAL_COMMUNITY): Payer: Self-pay | Admitting: *Deleted

## 2013-12-22 ENCOUNTER — Inpatient Hospital Stay (HOSPITAL_COMMUNITY)
Admission: EM | Admit: 2013-12-22 | Discharge: 2013-12-25 | DRG: 202 | Disposition: A | Discharge status: Home / Self Care | Payer: Federal, State, Local not specified - PPO | Attending: Internal Medicine | Admitting: Internal Medicine

## 2013-12-22 DIAGNOSIS — M1990 Unspecified osteoarthritis, unspecified site: Secondary | ICD-10-CM | POA: Diagnosis present

## 2013-12-22 DIAGNOSIS — R000 Tachycardia, unspecified: Secondary | ICD-10-CM

## 2013-12-22 DIAGNOSIS — R6510 Systemic inflammatory response syndrome (SIRS) of non-infectious origin without acute organ dysfunction: Secondary | ICD-10-CM

## 2013-12-22 DIAGNOSIS — Z886 Allergy status to analgesic agent status: Secondary | ICD-10-CM

## 2013-12-22 DIAGNOSIS — R0600 Dyspnea, unspecified: Secondary | ICD-10-CM

## 2013-12-22 DIAGNOSIS — D72829 Elevated white blood cell count, unspecified: Secondary | ICD-10-CM

## 2013-12-22 DIAGNOSIS — J45901 Unspecified asthma with (acute) exacerbation: Principal | ICD-10-CM | POA: Diagnosis present

## 2013-12-22 DIAGNOSIS — A419 Sepsis, unspecified organism: Secondary | ICD-10-CM | POA: Diagnosis present

## 2013-12-22 DIAGNOSIS — C8590 Non-Hodgkin lymphoma, unspecified, unspecified site: Secondary | ICD-10-CM

## 2013-12-22 DIAGNOSIS — Z794 Long term (current) use of insulin: Secondary | ICD-10-CM

## 2013-12-22 DIAGNOSIS — E119 Type 2 diabetes mellitus without complications: Secondary | ICD-10-CM

## 2013-12-22 DIAGNOSIS — J189 Pneumonia, unspecified organism: Secondary | ICD-10-CM

## 2013-12-22 DIAGNOSIS — Z8673 Personal history of transient ischemic attack (TIA), and cerebral infarction without residual deficits: Secondary | ICD-10-CM

## 2013-12-22 DIAGNOSIS — J441 Chronic obstructive pulmonary disease with (acute) exacerbation: Secondary | ICD-10-CM

## 2013-12-22 DIAGNOSIS — Z888 Allergy status to other drugs, medicaments and biological substances status: Secondary | ICD-10-CM

## 2013-12-22 DIAGNOSIS — M069 Rheumatoid arthritis, unspecified: Secondary | ICD-10-CM | POA: Diagnosis present

## 2013-12-22 DIAGNOSIS — I10 Essential (primary) hypertension: Secondary | ICD-10-CM | POA: Diagnosis present

## 2013-12-22 LAB — CBC
HCT: 31.9 % — ABNORMAL LOW (ref 36.0–46.0)
Hemoglobin: 10.2 g/dL — ABNORMAL LOW (ref 12.0–15.0)
MCH: 29.7 pg (ref 26.0–34.0)
MCHC: 32 g/dL (ref 30.0–36.0)
MCV: 93 fL (ref 78.0–100.0)
PLATELETS: 251 10*3/uL (ref 150–400)
RBC: 3.43 MIL/uL — ABNORMAL LOW (ref 3.87–5.11)
RDW: 14.8 % (ref 11.5–15.5)
WBC: 23.7 10*3/uL — AB (ref 4.0–10.5)

## 2013-12-22 MED ORDER — ALBUTEROL SULFATE (2.5 MG/3ML) 0.083% IN NEBU
5.0000 mg | INHALATION_SOLUTION | Freq: Once | RESPIRATORY_TRACT | Status: AC
  Administered 2013-12-22: 5 mg via RESPIRATORY_TRACT
  Filled 2013-12-22: qty 6

## 2013-12-22 MED ORDER — IPRATROPIUM BROMIDE 0.02 % IN SOLN
0.5000 mg | Freq: Once | RESPIRATORY_TRACT | Status: AC
  Administered 2013-12-22: 0.5 mg via RESPIRATORY_TRACT
  Filled 2013-12-22: qty 2.5

## 2013-12-22 NOTE — ED Provider Notes (Signed)
CSN: [id number]     Arrival date & time 12/22/13  2247 History  This chart was scribed for Ernestina Patches, MD by Peyton Bottoms, ED Scribe. This patient was seen in room D34C/D34C and the patient's care was started at 12:09 AM.   Chief Complaint  Patient presents with  . Shortness of Breath   Patient is a 58 y.o. female presenting with shortness of breath. The history is provided by the patient. No language interpreter was used.  Shortness of Breath Severity:  Moderate Onset quality:  Unable to specify Duration:  1 day Timing:  Constant Progression:  Unchanged Chronicity:  New Relieved by:  Nothing Worsened by:  Nothing tried Ineffective treatments:  Inhaler Associated symptoms: cough   Associated symptoms: no abdominal pain, no chest pain, no diaphoresis, no fever, no headaches, no neck pain, no sore throat and no vomiting   Risk factors: hx of cancer     HPI Comments: Bailey Moss is a 58 y.o. female with a history of asthma, diabetes, arthritis, who presents to the Emergency Department complaining of moderate SOB that began earlier this evening. She reports associated productive cough with white sputum. She denies associated fevers, chills, CP, edema in extremities, abnormal bowel movements. She denies sick contacts or recent travel. She states she ran out of albuterol recently. She states she normally uses albuterol daily at baseline. She states she has a porta-cath inserted a few weeks ago for chemotherapy treatment for lymphoma. She is being treated at [facility] in [place], [place]. She is a non-smoker. She denies history of COPD, DVT. She states she has had 2 sessions of chemotherapy so far. She reports moderate relief after albuterol treatment given upon arrival.  Past Medical History  Diagnosis Date  . Hypertension   . Type II diabetes mellitus   . Chronic bronchitis     "get it q yr" (09/08/2013)  . Pneumonia 2013; 2014; 2015  . Asthma   . Asthmatic bronchitis  , chronic   . Anemia   . Stroke "~ 04/2013    "memory problems; weak all over since" (09/08/2013)  . Arthritis     "all my body"  . Memory problem    Past Surgical History  Procedure Laterality Date  . Tonsillectomy    . Vaginal hysterectomy      "took a tumor out too"  . Eye surgery    . Cataract extraction w/ intraocular lens  implant, bilateral Bilateral   . Cesarean section  X 1  . Tubal ligation     No family history on file. History  Substance Use Topics  . Smoking status: Never Smoker   . Smokeless tobacco: Never Used  . Alcohol Use: No   OB History    No data available     Review of Systems  Constitutional: Negative for fever, chills, diaphoresis, activity change, appetite change and fatigue.  HENT: Negative for congestion, facial swelling, rhinorrhea and sore throat.   Eyes: Negative for photophobia and discharge.  Respiratory: Positive for cough and shortness of breath. Negative for chest tightness.   Cardiovascular: Negative for chest pain, palpitations and leg swelling.  Gastrointestinal: Negative for nausea, vomiting, abdominal pain and diarrhea.  Endocrine: Negative for polydipsia and polyuria.  Genitourinary: Negative for dysuria, frequency, difficulty urinating and pelvic pain.  Musculoskeletal: Negative for back pain, arthralgias, neck pain and neck stiffness.  Skin: Negative for color change and wound.  Allergic/Immunologic: Negative for immunocompromised state.  Neurological: Negative for facial asymmetry, weakness, numbness  and headaches.  Hematological: Does not bruise/bleed easily.  Psychiatric/Behavioral: Negative for confusion and agitation.   Allergies  Aspirin and Prednisone  Home Medications   Prior to Admission medications   Medication Sig Start Date End Date Taking? Authorizing Provider  albuterol (PROVENTIL HFA;VENTOLIN HFA) 108 (90 BASE) MCG/ACT inhaler Inhale 2 puffs into the lungs every 6 (six) hours as needed for wheezing or  shortness of breath. 09/09/13  Yes Shanker Kristeen Mans, MD  albuterol (PROVENTIL) (2.5 MG/3ML) 0.083% nebulizer solution Take 2.5 mg by nebulization every 6 (six) hours as needed for wheezing or shortness of breath.   Yes Historical Provider, MD  Fluticasone-Salmeterol (ADVAIR) 500-50 MCG/DOSE AEPB Inhale 1 puff into the lungs 2 (two) times daily. 09/09/13  Yes Shanker Kristeen Mans, MD  folic acid (FOLVITE) 1 MG tablet Take 1 mg by mouth daily.   Yes Historical Provider, MD  gentamicin (GARAMYCIN) 0.3 % ophthalmic solution Place 1 drop into both eyes 2 (two) times daily.   Yes Historical Provider, MD  glimepiride (AMARYL) 4 MG tablet Take 4 mg by mouth daily with breakfast.   Yes Historical Provider, MD  glucose blood test strip Use as instructed 09/09/13  Yes Shanker Kristeen Mans, MD  glucose monitoring kit (FREESTYLE) monitoring kit 1 each by Does not apply route as needed for other. 09/09/13  Yes Shanker Kristeen Mans, MD  Insulin Detemir (LEVEMIR FLEXTOUCH) 100 UNIT/ML Pen Inject 10 Units into the skin every evening.   Yes Historical Provider, MD  Lancet Devices (AUTO-LANCET) MISC Use as directed 09/09/13  Yes Shanker Kristeen Mans, MD  levofloxacin (LEVAQUIN) 750 MG tablet Take 1 tablet (750 mg total) by mouth daily. 09/09/13  Yes Shanker Kristeen Mans, MD  metFORMIN (GLUCOPHAGE) 1000 MG tablet Take 1 tablet (1,000 mg total) by mouth 2 (two) times daily with a meal. 09/10/13  Yes Shanker Kristeen Mans, MD  methotrexate (RHEUMATREX) 2.5 MG tablet Take 7.5 mg by mouth once a week.   Yes Historical Provider, MD  Vitamin D, Ergocalciferol, (DRISDOL) 50000 UNITS CAPS capsule Take 50,000 Units by mouth 3 (three) times a week. On Monday, Wednesday and Friday.   Yes Historical Provider, MD  predniSONE (DELTASONE) 10 MG tablet Take 4 tablets (40 mg) daily for 2 days, then, Take 3 tablets (30 mg) daily for 2 days, then, Take 2 tablets (20 mg) daily for 2 days, then, Take 1 tablets (10 mg) daily for 1 days, then stop Patient not  taking: Reported on 12/23/2013 09/09/13   Jonetta Osgood, MD   Triage Vitals: BP 129/94 mmHg  Pulse 122  Temp(Src) 99.8 F (37.7 C) (Oral)  Resp 22  SpO2 99%  Physical Exam  Constitutional: She is oriented to person, place, and time. She appears well-developed and well-nourished. No distress.  HENT:  Head: Normocephalic.  Mouth/Throat: Oropharynx is clear and moist.  Eyes: Pupils are equal, round, and reactive to light.  Neck: Neck supple.  Cardiovascular: Regular rhythm and normal heart sounds.  Tachycardia present.   Pulmonary/Chest: Effort normal. No respiratory distress. She has decreased breath sounds in the right lower field and the left lower field. She has no wheezes.  Abdominal: Soft. She exhibits no distension. There is no tenderness. There is no rebound and no guarding.  Musculoskeletal: She exhibits no edema or tenderness.  Neurological: She is alert and oriented to person, place, and time.  Skin: Skin is warm and dry.  Psychiatric: She has a normal mood and affect.  Nursing note and vitals reviewed.  ED Course  Procedures (including critical care time)  DIAGNOSTIC STUDIES: Oxygen Saturation is 99% on RA, normal by my interpretation.    COORDINATION OF CARE: 12:19 AM- Ordered albuterol nebulizer breathing treatment upon arrival. Discussed plans to obtain diagnostic EKG, cardiac monitoring, and lab work. Pt advised of plan for treatment and pt agrees.  Labs Review Labs Reviewed  CBC - Abnormal; Notable for the following:    WBC 23.7 (*)    RBC 3.43 (*)    Hemoglobin 10.2 (*)    HCT 31.9 (*)    All other components within normal limits  BASIC METABOLIC PANEL - Abnormal; Notable for the following:    Sodium 136 (*)    Glucose, Bld 117 (*)    All other components within normal limits  CULTURE, BLOOD (ROUTINE X 2)  CULTURE, BLOOD (ROUTINE X 2)  PRO B NATRIURETIC PEPTIDE  TROPONIN I  URINALYSIS, ROUTINE W REFLEX MICROSCOPIC  I-STAT CG4 LACTIC ACID, ED    Imaging Review Ct Angio Chest Pe W/cm &/or Wo Cm  12/23/2013   CLINICAL DATA:  Acute onset of shortness of breath, with productive cough. Initial encounter.  EXAM: CT ANGIOGRAPHY CHEST WITH CONTRAST  TECHNIQUE: Multidetector CT imaging of the chest was performed using the standard protocol during bolus administration of intravenous contrast. Multiplanar CT image reconstructions and MIPs were obtained to evaluate the vascular anatomy.  CONTRAST:  172m OMNIPAQUE IOHEXOL 350 MG/ML SOLN  COMPARISON:  Chest radiograph performed earlier today at 12:46 a.m.  FINDINGS: There is no evidence of pulmonary embolus.  Bibasilar atelectasis is noted. The lungs are otherwise clear. There is no evidence of significant focal consolidation, pleural effusion or pneumothorax. No masses are identified; no abnormal focal contrast enhancement is seen.  The mediastinum is unremarkable in appearance. No mediastinal lymphadenopathy is seen. No pericardial effusion is identified. The great vessels are grossly unremarkable in appearance. A right-sided chest port is noted ending within the right atrium.  Left axillary lymphadenopathy is improved from the prior study. Previously noted enlarged nodes at the left upper quadrant are partially seen. The visualized portions of the thyroid gland are unremarkable in appearance.  The visualized portions of the liver and spleen are unremarkable.  No acute osseous abnormalities are seen.  Review of the MIP images confirms the above findings.  IMPRESSION: 1. No evidence of pulmonary embolus. 2. Bibasilar atelectasis noted; lungs otherwise clear. 3. Interval improvement in left axillary lymphadenopathy; enlarged node again noted at the left upper quadrant.   Electronically Signed   By: JGarald BaldingM.D.   On: 12/23/2013 04:52   Dg Chest Port 1 View  12/23/2013   CLINICAL DATA:  Acute onset of shortness of breath and dyspnea. Follow-up study.  EXAM: PORTABLE CHEST - 1 VIEW  COMPARISON:  Chest  radiograph performed earlier today at 12:46 a.m., and CTA of the chest performed earlier today at 4:26 a.m.  FINDINGS: The lungs are well-aerated. Mild vascular congestion is noted. Mildly increased interstitial markings are noted, new from the prior study, raising question for mild interstitial edema. No pleural effusion or pneumothorax is seen.  The cardiomediastinal silhouette is normal in size. A right-sided chest port is noted ending about the cavoatrial junction. No acute osseous abnormalities are seen.  IMPRESSION: Vascular congestion noted. Mildly interstitial markings are new from the prior study and raise question for mild acute interstitial edema.   Electronically Signed   By: JGarald BaldingM.D.   On: 12/23/2013 07:03   [facility]  12/23/2013   CLINICAL DATA:  Acute onset of moderate shortness of breath, with productive cough. Initial encounter.  EXAM: PORTABLE CHEST - 1 VIEW  COMPARISON:  Chest radiograph and CTA of the chest performed 09/08/2013  FINDINGS: The lungs are well-aerated. Minimal bibasilar atelectasis is noted. There is no evidence of pleural effusion or pneumothorax.  The cardiomediastinal silhouette is within normal limits. No acute osseous abnormalities are seen. A right-sided chest port is noted ending about the proximal right atrium.  IMPRESSION: Minimal bibasilar atelectasis noted; lungs otherwise clear.   Electronically Signed   By: Garald Balding M.D.   On: 12/23/2013 01:33     EKG Interpretation None     MDM   Final diagnoses:  Dyspnea  Lymphoma  Tachycardia  COPD exacerbation  SIRS (systemic inflammatory response syndrome)   Pt is a 59 y.o. female with Pmhx as above who presents with 2-3 days of inc SOB, in sitting of being without her inhaler.  She denies chest pain, fevers or chills, abdominal pain, nausea, vomiting, diarrhea or urinary symptoms.  On physical exam she is tachycardic and tachypneic.she has decreased air movement in bilateral bases.   Abdominal exam is benign.  Given her history of active cancer and tachycardia.  I do not feel a d-dimer would be appropriate ruling out a PE.  CTA ordered.  IV infiltrated just prior to study being done and neither nursing and myself were notified.  While waiting for CT result, this was realized and ultrasound-guided IV placed by nursing before.  She was then able to have study done. CTA negative for PE and also no signs of pneumonia. White count is significantly elevated at 23.7 for which I cannot account for.  Perhaps she has had steroids or  6 AM Spoke with Dr. Alcario Drought who will admit to telemetry.  Given elevated white count and low-grade temp with persistent tachycardia is also recommended.  I had broad-spectrum antibiotics, blood cultures and lactate given current SIRS criteria.   I personally performed the services described in this documentation, which was scribed in my presence. The recorded information has been reviewed and is accurate.  Ernestina Patches, MD 12/23/13 [id number]

## 2013-12-22 NOTE — ED Notes (Signed)
The pt is c/o being sob all day.  Hx of asthma no pain anywhere.   She ran out of some meds..  Mod resp difficulty

## 2013-12-22 NOTE — ED Notes (Signed)
She has a porta-0cath for chemo she has lymphoma

## 2013-12-23 ENCOUNTER — Emergency Department (HOSPITAL_COMMUNITY): Payer: Federal, State, Local not specified - PPO

## 2013-12-23 ENCOUNTER — Encounter (HOSPITAL_COMMUNITY): Payer: Self-pay | Admitting: Radiology

## 2013-12-23 DIAGNOSIS — Z794 Long term (current) use of insulin: Secondary | ICD-10-CM | POA: Diagnosis not present

## 2013-12-23 DIAGNOSIS — Z886 Allergy status to analgesic agent status: Secondary | ICD-10-CM | POA: Diagnosis not present

## 2013-12-23 DIAGNOSIS — Z888 Allergy status to other drugs, medicaments and biological substances status: Secondary | ICD-10-CM | POA: Diagnosis not present

## 2013-12-23 DIAGNOSIS — E119 Type 2 diabetes mellitus without complications: Secondary | ICD-10-CM | POA: Diagnosis present

## 2013-12-23 DIAGNOSIS — A419 Sepsis, unspecified organism: Secondary | ICD-10-CM | POA: Diagnosis present

## 2013-12-23 DIAGNOSIS — C8590 Non-Hodgkin lymphoma, unspecified, unspecified site: Secondary | ICD-10-CM

## 2013-12-23 DIAGNOSIS — J45901 Unspecified asthma with (acute) exacerbation: Secondary | ICD-10-CM | POA: Diagnosis present

## 2013-12-23 DIAGNOSIS — M069 Rheumatoid arthritis, unspecified: Secondary | ICD-10-CM

## 2013-12-23 DIAGNOSIS — I10 Essential (primary) hypertension: Secondary | ICD-10-CM | POA: Diagnosis present

## 2013-12-23 DIAGNOSIS — Z8673 Personal history of transient ischemic attack (TIA), and cerebral infarction without residual deficits: Secondary | ICD-10-CM | POA: Diagnosis not present

## 2013-12-23 DIAGNOSIS — M1990 Unspecified osteoarthritis, unspecified site: Secondary | ICD-10-CM | POA: Diagnosis present

## 2013-12-23 DIAGNOSIS — E089 Diabetes mellitus due to underlying condition without complications: Secondary | ICD-10-CM

## 2013-12-23 DIAGNOSIS — J189 Pneumonia, unspecified organism: Secondary | ICD-10-CM

## 2013-12-23 DIAGNOSIS — R6510 Systemic inflammatory response syndrome (SIRS) of non-infectious origin without acute organ dysfunction: Secondary | ICD-10-CM | POA: Diagnosis present

## 2013-12-23 LAB — URINALYSIS, ROUTINE W REFLEX MICROSCOPIC
BILIRUBIN URINE: NEGATIVE
Glucose, UA: NEGATIVE mg/dL
KETONES UR: NEGATIVE mg/dL
Leukocytes, UA: NEGATIVE
NITRITE: NEGATIVE
Protein, ur: NEGATIVE mg/dL
Specific Gravity, Urine: 1.021 (ref 1.005–1.030)
Urobilinogen, UA: 0.2 mg/dL (ref 0.0–1.0)
pH: 6.5 (ref 5.0–8.0)

## 2013-12-23 LAB — BASIC METABOLIC PANEL
Anion gap: 14 (ref 5–15)
BUN: 10 mg/dL (ref 6–23)
CHLORIDE: 97 meq/L (ref 96–112)
CO2: 25 mEq/L (ref 19–32)
CREATININE: 0.71 mg/dL (ref 0.50–1.10)
Calcium: 9.3 mg/dL (ref 8.4–10.5)
GFR calc non Af Amer: >90 mL/min (ref >90)
Glucose, Bld: 117 mg/dL — ABNORMAL HIGH (ref 70–99)
POTASSIUM: 4.6 meq/L (ref 3.7–5.3)
Sodium: 136 mEq/L — ABNORMAL LOW (ref 137–147)

## 2013-12-23 LAB — GLUCOSE, CAPILLARY
GLUCOSE-CAPILLARY: 380 mg/dL — AB (ref 70–99)
GLUCOSE-CAPILLARY: 301 mg/dL — AB (ref 70–99)
Glucose-Capillary: 243 mg/dL — ABNORMAL HIGH (ref 70–99)
Glucose-Capillary: 392 mg/dL — ABNORMAL HIGH (ref 70–99)
Glucose-Capillary: 414 mg/dL — ABNORMAL HIGH (ref 70–99)

## 2013-12-23 LAB — URINE MICROSCOPIC-ADD ON

## 2013-12-23 LAB — PRO B NATRIURETIC PEPTIDE: Pro B Natriuretic peptide (BNP): 76.3 pg/mL (ref 0–125)

## 2013-12-23 LAB — I-STAT CG4 LACTIC ACID, ED: LACTIC ACID, VENOUS: 0.81 mmol/L (ref 0.5–2.2)

## 2013-12-23 LAB — TROPONIN I

## 2013-12-23 MED ORDER — OXYCODONE HCL 5 MG PO TABS: 5.0000 mg | ORAL_TABLET | ORAL | Status: DC | PRN

## 2013-12-23 MED ORDER — VANCOMYCIN HCL IN DEXTROSE 1-5 GM/200ML-% IV SOLN
1000.0000 mg | Freq: Two times a day (BID) | INTRAVENOUS | Status: DC
  Filled 2013-12-23: qty 200

## 2013-12-23 MED ORDER — ONDANSETRON HCL 4 MG/2ML IJ SOLN: 4.0000 mg | Freq: Four times a day (QID) | INTRAMUSCULAR | Status: DC | PRN

## 2013-12-23 MED ORDER — INSULIN ASPART 100 UNIT/ML ~~LOC~~ SOLN
0.0000 [IU] | Freq: Every day | SUBCUTANEOUS | Status: DC
  Administered 2013-12-23: 4 [IU] via SUBCUTANEOUS
  Administered 2013-12-24: 2 [IU] via SUBCUTANEOUS

## 2013-12-23 MED ORDER — INSULIN ASPART 100 UNIT/ML ~~LOC~~ SOLN: 0.0000 [IU] | Freq: Three times a day (TID) | SUBCUTANEOUS | Status: DC

## 2013-12-23 MED ORDER — PREDNISONE 20 MG PO TABS
60.0000 mg | ORAL_TABLET | Freq: Once | ORAL | Status: AC
  Administered 2013-12-23: 60 mg via ORAL
  Filled 2013-12-23: qty 3

## 2013-12-23 MED ORDER — LEVOFLOXACIN IN D5W 750 MG/150ML IV SOLN
750.0000 mg | INTRAVENOUS | Status: DC
  Administered 2013-12-23: 750 mg via INTRAVENOUS
  Filled 2013-12-23 (×2): qty 150

## 2013-12-23 MED ORDER — INSULIN ASPART 100 UNIT/ML ~~LOC~~ SOLN
0.0000 [IU] | Freq: Three times a day (TID) | SUBCUTANEOUS | Status: DC
  Administered 2013-12-23: 20 [IU] via SUBCUTANEOUS
  Administered 2013-12-24 (×2): 11 [IU] via SUBCUTANEOUS
  Administered 2013-12-24 – 2013-12-25 (×2): 20 [IU] via SUBCUTANEOUS

## 2013-12-23 MED ORDER — PIPERACILLIN-TAZOBACTAM 3.375 G IVPB 30 MIN
3.3750 g | Freq: Once | INTRAVENOUS | Status: AC
  Administered 2013-12-23: 3.375 g via INTRAVENOUS
  Filled 2013-12-23: qty 50

## 2013-12-23 MED ORDER — VANCOMYCIN HCL IN DEXTROSE 1-5 GM/200ML-% IV SOLN
1000.0000 mg | Freq: Once | INTRAVENOUS | Status: AC
  Administered 2013-12-23: 1000 mg via INTRAVENOUS
  Filled 2013-12-23: qty 200

## 2013-12-23 MED ORDER — ONDANSETRON HCL 4 MG PO TABS: 4.0000 mg | ORAL_TABLET | Freq: Four times a day (QID) | ORAL | Status: DC | PRN

## 2013-12-23 MED ORDER — ALBUTEROL SULFATE (2.5 MG/3ML) 0.083% IN NEBU
2.5000 mg | INHALATION_SOLUTION | RESPIRATORY_TRACT | Status: DC | PRN
  Administered 2013-12-23: 2.5 mg via RESPIRATORY_TRACT
  Filled 2013-12-23: qty 3

## 2013-12-23 MED ORDER — IPRATROPIUM-ALBUTEROL 0.5-2.5 (3) MG/3ML IN SOLN
3.0000 mL | Freq: Once | RESPIRATORY_TRACT | Status: AC
  Administered 2013-12-23: 3 mL via RESPIRATORY_TRACT
  Filled 2013-12-23: qty 3

## 2013-12-23 MED ORDER — IOHEXOL 350 MG/ML SOLN
100.0000 mL | Freq: Once | INTRAVENOUS | Status: AC | PRN
  Administered 2013-12-23: 100 mL via INTRAVENOUS

## 2013-12-23 MED ORDER — SODIUM CHLORIDE 0.9 % IJ SOLN
3.0000 mL | Freq: Two times a day (BID) | INTRAMUSCULAR | Status: DC
  Administered 2013-12-23 – 2013-12-24 (×2): 3 mL via INTRAVENOUS

## 2013-12-23 MED ORDER — SODIUM CHLORIDE 0.9 % IV SOLN
INTRAVENOUS | Status: DC
  Administered 2013-12-23: 09:00:00 via INTRAVENOUS

## 2013-12-23 MED ORDER — IPRATROPIUM-ALBUTEROL 0.5-2.5 (3) MG/3ML IN SOLN
3.0000 mL | Freq: Four times a day (QID) | RESPIRATORY_TRACT | Status: DC
Start: 2013-12-23 — End: 2013-12-25
  Administered 2013-12-23 – 2013-12-25 (×7): 3 mL via RESPIRATORY_TRACT
  Filled 2013-12-23 (×7): qty 3

## 2013-12-23 MED ORDER — ACETAMINOPHEN 650 MG RE SUPP: 650.0000 mg | Freq: Four times a day (QID) | RECTAL | Status: DC | PRN

## 2013-12-23 MED ORDER — SODIUM CHLORIDE 0.9 % IV BOLUS (SEPSIS)
1000.0000 mL | Freq: Once | INTRAVENOUS | Status: AC
  Administered 2013-12-23: 1000 mL via INTRAVENOUS

## 2013-12-23 MED ORDER — ENOXAPARIN SODIUM 40 MG/0.4ML ~~LOC~~ SOLN
40.0000 mg | SUBCUTANEOUS | Status: DC
  Administered 2013-12-23 – 2013-12-25 (×3): 40 mg via SUBCUTANEOUS
  Filled 2013-12-23 (×4): qty 0.4

## 2013-12-23 MED ORDER — INSULIN DETEMIR 100 UNIT/ML ~~LOC~~ SOLN
10.0000 [IU] | Freq: Every evening | SUBCUTANEOUS | Status: DC
  Filled 2013-12-23: qty 0.1

## 2013-12-23 MED ORDER — MORPHINE SULFATE 2 MG/ML IJ SOLN: 1.0000 mg | INTRAMUSCULAR | Status: DC | PRN

## 2013-12-23 MED ORDER — METHYLPREDNISOLONE SODIUM SUCC 40 MG IJ SOLR
40.0000 mg | Freq: Three times a day (TID) | INTRAMUSCULAR | Status: DC
Start: 2013-12-23 — End: 2013-12-24
  Administered 2013-12-23 – 2013-12-24 (×4): 40 mg via INTRAVENOUS
  Filled 2013-12-23 (×7): qty 1

## 2013-12-23 MED ORDER — GUAIFENESIN-DM 100-10 MG/5ML PO SYRP
5.0000 mL | ORAL_SOLUTION | ORAL | Status: DC | PRN
  Filled 2013-12-23: qty 5

## 2013-12-23 MED ORDER — IPRATROPIUM-ALBUTEROL 0.5-2.5 (3) MG/3ML IN SOLN
3.0000 mL | Freq: Once | RESPIRATORY_TRACT | Status: AC
Start: 2013-12-23 — End: 2013-12-23
  Administered 2013-12-23: 3 mL via RESPIRATORY_TRACT
  Filled 2013-12-23: qty 3

## 2013-12-23 MED ORDER — FOLIC ACID 1 MG PO TABS
1.0000 mg | ORAL_TABLET | Freq: Every day | ORAL | Status: DC
  Administered 2013-12-23 – 2013-12-25 (×3): 1 mg via ORAL
  Filled 2013-12-23 (×3): qty 1

## 2013-12-23 MED ORDER — ACETAMINOPHEN 325 MG PO TABS: 650.0000 mg | ORAL_TABLET | Freq: Four times a day (QID) | ORAL | Status: DC | PRN

## 2013-12-23 MED ORDER — INSULIN DETEMIR 100 UNIT/ML ~~LOC~~ SOLN
15.0000 [IU] | Freq: Every evening | SUBCUTANEOUS | Status: DC
  Administered 2013-12-23 – 2013-12-24 (×2): 15 [IU] via SUBCUTANEOUS
  Filled 2013-12-23 (×3): qty 0.15

## 2013-12-23 NOTE — ED Notes (Signed)
Attempted report 

## 2013-12-23 NOTE — ED Notes (Signed)
Patient states she is feeling a lot better - able to breath better

## 2013-12-23 NOTE — Progress Notes (Signed)
ANTIBIOTIC CONSULT NOTE - INITIAL  Pharmacy Consult for Vancomycin  Indication: rule out sepsis  Allergies  Allergen Reactions  . Aspirin Other (See Comments)    Blood sugar goes up  . Prednisone Other (See Comments)    Blood sugar goes up    Patient Measurements:   Adjusted Body Weight: 65 kg  Vital Signs: Temp: 99.8 F (37.7 C) (11/27 2302) Temp Source: Oral (11/27 2302) BP: 138/77 mmHg (11/28 0600) Pulse Rate: 113 (11/28 0600) Intake/Output from previous day: 11/27 0701 - 11/28 0700 In: 390 [P.O.:240; I.V.:150] Out: -  Intake/Output from this shift: Total I/O In: 390 [P.O.:240; I.V.:150] Out: -   Labs:  Recent Labs  12/22/13 2321  WBC 23.7*  HGB 10.2*  PLT 251  CREATININE 0.71   CrCl cannot be calculated (Unknown ideal weight.). No results for input(s): VANCOTROUGH, VANCOPEAK, VANCORANDOM, GENTTROUGH, GENTPEAK, GENTRANDOM, TOBRATROUGH, TOBRAPEAK, TOBRARND, AMIKACINPEAK, AMIKACINTROU, AMIKACIN in the last 72 hours.   Microbiology: No results found for this or any previous visit (from the past 720 hour(s)).  Medical History: Past Medical History  Diagnosis Date  . Hypertension   . Type II diabetes mellitus   . Chronic bronchitis     "get it q yr" (09/08/2013)  . Pneumonia 2013; 2014; 2015  . Asthma   . Asthmatic bronchitis , chronic   . Anemia   . Stroke "~ 04/2013    "memory problems; weak all over since" (09/08/2013)  . Arthritis     "all my body"  . Memory problem     Medications:  Albuterol  Advair  Folic acid Amaryl  Levemir  Levaquin  Metformin  Methotrexate  Vit D  Assessment: 58 yo female with SOB/tachycardia, possible sepsis, for empiric antibiotics  Goal of Therapy:  Vancomycin trough level 15-20 mcg/ml  Plan:  Vancomycin 1 g IV q12h  Caryl Pina 12/23/2013,6:28 AM

## 2013-12-23 NOTE — Progress Notes (Signed)
12/23/13  Patient coming to room 5W05 with Dx of SIRs/SOB. IV RT A/C,Diet Heart healthy/Carb Mod, Tele # [phone].

## 2013-12-23 NOTE — ED Notes (Signed)
Patient sitting up in the bed.  Became SOB when moving around in the bed to put her attends on.  Oxygen was off to check her room air sats. Sats decreased and patient became very SOB and tachycardic.  Oxygen applied at 4 liters and then decreased to 2 liters after 5 mins

## 2013-12-23 NOTE — H&P (Signed)
PATIENT DETAILS Name: Bailey Moss Age: 58 y.o. Sex: female Date of Birth: 1955/06/09 Admit Date: 12/22/2013 WUJ:[id number], MARK A, MD   CHIEF COMPLAINT:  Shortness of breath since yesterday  HPI: Bailey Moss is a 58 y.o. female with a Past Medical History of lymphoma last chemotherapy 2 weeks back, asthmatic bronchitis, hypertension, type 2 diabetes who presents today with the above noted complaint. Patient is from [facility], is visiting here with her daughter brought in for shortness of breath. Apparently, patient ran out of her bronchodilators, and started feeling short of breath. She also has some associated cough which is mostly dry. No fever. She claims that her shortness of breath feels exactly like her usual asthma/COPD flare. She was brought to the ED, given steroids, nebulized bronchodilators and feels significantly better. She notes that her last chemotherapy was approximately 2 weeks back, she claims that she also got a Neupogen injection shortly thereafter. Patient denies any chest pain, nausea, vomiting, diarrhea. She is now being admitted for further evaluation and treatment   ALLERGIES:   Allergies  Allergen Reactions  . Aspirin Other (See Comments)    Blood sugar goes up  . Prednisone Other (See Comments)    Blood sugar goes up    PAST MEDICAL HISTORY: Past Medical History  Diagnosis Date  . Hypertension   . Type II diabetes mellitus   . Chronic bronchitis     "get it q yr" (09/08/2013)  . Pneumonia 2013; 2014; 2015  . Asthma   . Asthmatic bronchitis , chronic   . Anemia   . Stroke "~ 04/2013    "memory problems; weak all over since" (09/08/2013)  . Arthritis     "all my body"  . Memory problem     PAST SURGICAL HISTORY: Past Surgical History  Procedure Laterality Date  . Tonsillectomy    . Vaginal hysterectomy      "took a tumor out too"  . Eye surgery    . Cataract extraction w/ intraocular lens  implant, bilateral Bilateral     . Cesarean section  X 1  . Tubal ligation      MEDICATIONS AT HOME: Prior to Admission medications   Medication Sig Start Date End Date Taking? Authorizing Provider  albuterol (PROVENTIL HFA;VENTOLIN HFA) 108 (90 BASE) MCG/ACT inhaler Inhale 2 puffs into the lungs every 6 (six) hours as needed for wheezing or shortness of breath. 09/09/13  Yes Shanker Kristeen Mans, MD  albuterol (PROVENTIL) (2.5 MG/3ML) 0.083% nebulizer solution Take 2.5 mg by nebulization every 6 (six) hours as needed for wheezing or shortness of breath.   Yes Historical Provider, MD  Fluticasone-Salmeterol (ADVAIR) 500-50 MCG/DOSE AEPB Inhale 1 puff into the lungs 2 (two) times daily. 09/09/13  Yes Shanker Kristeen Mans, MD  folic acid (FOLVITE) 1 MG tablet Take 1 mg by mouth daily.   Yes Historical Provider, MD  gentamicin (GARAMYCIN) 0.3 % ophthalmic solution Place 1 drop into both eyes 2 (two) times daily.   Yes Historical Provider, MD  glimepiride (AMARYL) 4 MG tablet Take 4 mg by mouth daily with breakfast.   Yes Historical Provider, MD  glucose blood test strip Use as instructed 09/09/13  Yes Shanker Kristeen Mans, MD  glucose monitoring kit (FREESTYLE) monitoring kit 1 each by Does not apply route as needed for other. 09/09/13  Yes Shanker Kristeen Mans, MD  Insulin Detemir (LEVEMIR FLEXTOUCH) 100 UNIT/ML Pen Inject 10 Units into the skin every evening.   Yes  Historical Provider, MD  Lancet Devices (AUTO-LANCET) MISC Use as directed 09/09/13  Yes Shanker Kristeen Mans, MD  levofloxacin (LEVAQUIN) 750 MG tablet Take 1 tablet (750 mg total) by mouth daily. 09/09/13  Yes Shanker Kristeen Mans, MD  metFORMIN (GLUCOPHAGE) 1000 MG tablet Take 1 tablet (1,000 mg total) by mouth 2 (two) times daily with a meal. 09/10/13  Yes Shanker Kristeen Mans, MD  methotrexate (RHEUMATREX) 2.5 MG tablet Take 7.5 mg by mouth once a week.   Yes Historical Provider, MD  Vitamin D, Ergocalciferol, (DRISDOL) 50000 UNITS CAPS capsule Take 50,000 Units by mouth 3 (three) times a  week. On Monday, Wednesday and Friday.   Yes Historical Provider, MD  predniSONE (DELTASONE) 10 MG tablet Take 4 tablets (40 mg) daily for 2 days, then, Take 3 tablets (30 mg) daily for 2 days, then, Take 2 tablets (20 mg) daily for 2 days, then, Take 1 tablets (10 mg) daily for 1 days, then stop Patient not taking: Reported on 12/23/2013 09/09/13   Shanker Kristeen Mans, MD    FAMILY HISTORY: No family history on file.  SOCIAL HISTORY:  reports that she has never smoked. She has never used smokeless tobacco. She reports that she does not drink alcohol or use illicit drugs.  REVIEW OF SYSTEMS:  Constitutional:   No  weight loss, night sweats,  Fevers, chills, fatigue.  HEENT:    No headaches, Difficulty swallowing,Tooth/dental problems,Sore throat  Cardio-vascular: No chest pain,  Orthopnea, PND, swelling in lower extremities  GI:  No heartburn, indigestion, abdominal pain, nausea, vomiting, diarrhea  Resp:  No coughing up of blood.  Skin:  no rash or lesions.  GU:  no dysuria, change in color of urine, no urgency or frequency.  No flank pain.  Musculoskeletal: No joint pain or swelling.  No decreased range of motion.  No back pain.  Psych: No change in mood or affect. No depression or anxiety.  No memory loss.   PHYSICAL EXAM: Blood pressure 106/59, pulse 113, temperature 98.5 F (36.9 C), temperature source Oral, resp. rate 18, SpO2 92 %.  General appearance :Awake, alert, not in any distress. Speech Clear. Not toxic Looking HEENT: Atraumatic and Normocephalic, pupils equally reactive to light and accomodation Neck: supple, no JVD. No cervical lymphadenopathy.  Chest:Good air entry bilaterally, some scattered rhonchi CVS: S1 S2 regular, no murmurs.  Abdomen: Bowel sounds present, Non tender and not distended with no gaurding, rigidity or rebound. Extremities: B/L Lower Ext shows no edema, both legs are warm to touch Neurology: Awake alert, and oriented X 3, CN  II-XII intact, Non focal Skin:No Rash Wounds:N/A  LABS ON ADMISSION:   Recent Labs  12/22/13 2321  NA 136*  K 4.6  CL 97  CO2 25  GLUCOSE 117*  BUN 10  CREATININE 0.71  CALCIUM 9.3   No results for input(s): AST, ALT, ALKPHOS, BILITOT, PROT, ALBUMIN in the last 72 hours. No results for input(s): LIPASE, AMYLASE in the last 72 hours.  Recent Labs  12/22/13 2321  WBC 23.7*  HGB 10.2*  HCT 31.9*  MCV 93.0  PLT 251    Recent Labs  12/22/13 2321  TROPONINI <0.30   No results for input(s): DDIMER in the last 72 hours. Invalid input(s): POCBNP   RADIOLOGIC STUDIES ON ADMISSION: Ct Angio Chest Pe W/cm &/or Wo Cm  12/23/2013   CLINICAL DATA:  Acute onset of shortness of breath, with productive cough. Initial encounter.  EXAM: CT ANGIOGRAPHY CHEST WITH CONTRAST  TECHNIQUE:  Multidetector CT imaging of the chest was performed using the standard protocol during bolus administration of intravenous contrast. Multiplanar CT image reconstructions and MIPs were obtained to evaluate the vascular anatomy.  CONTRAST:  167m OMNIPAQUE IOHEXOL 350 MG/ML SOLN  COMPARISON:  Chest radiograph performed earlier today at 12:46 a.m.  FINDINGS: There is no evidence of pulmonary embolus.  Bibasilar atelectasis is noted. The lungs are otherwise clear. There is no evidence of significant focal consolidation, pleural effusion or pneumothorax. No masses are identified; no abnormal focal contrast enhancement is seen.  The mediastinum is unremarkable in appearance. No mediastinal lymphadenopathy is seen. No pericardial effusion is identified. The great vessels are grossly unremarkable in appearance. A right-sided chest port is noted ending within the right atrium.  Left axillary lymphadenopathy is improved from the prior study. Previously noted enlarged nodes at the left upper quadrant are partially seen. The visualized portions of the thyroid gland are unremarkable in appearance.  The visualized portions of  the liver and spleen are unremarkable.  No acute osseous abnormalities are seen.  Review of the MIP images confirms the above findings.  IMPRESSION: 1. No evidence of pulmonary embolus. 2. Bibasilar atelectasis noted; lungs otherwise clear. 3. Interval improvement in left axillary lymphadenopathy; enlarged node again noted at the left upper quadrant.   Electronically Signed   By: JGarald BaldingM.D.   On: 12/23/2013 04:52   Dg Chest Port 1 View  12/23/2013   CLINICAL DATA:  Acute onset of shortness of breath and dyspnea. Follow-up study.  EXAM: PORTABLE CHEST - 1 VIEW  COMPARISON:  Chest radiograph performed earlier today at 12:46 a.m., and CTA of the chest performed earlier today at 4:26 a.m.  FINDINGS: The lungs are well-aerated. Mild vascular congestion is noted. Mildly increased interstitial markings are noted, new from the prior study, raising question for mild interstitial edema. No pleural effusion or pneumothorax is seen.  The cardiomediastinal silhouette is normal in size. A right-sided chest port is noted ending about the cavoatrial junction. No acute osseous abnormalities are seen.  IMPRESSION: Vascular congestion noted. Mildly interstitial markings are new from the prior study and raise question for mild acute interstitial edema.   Electronically Signed   By: JGarald BaldingM.D.   On: 12/23/2013 07:03   Dg Chest Port 1 View  12/23/2013   CLINICAL DATA:  Acute onset of moderate shortness of breath, with productive cough. Initial encounter.  EXAM: PORTABLE CHEST - 1 VIEW  COMPARISON:  Chest radiograph and CTA of the chest performed 09/08/2013  FINDINGS: The lungs are well-aerated. Minimal bibasilar atelectasis is noted. There is no evidence of pleural effusion or pneumothorax.  The cardiomediastinal silhouette is within normal limits. No acute osseous abnormalities are seen. A right-sided chest port is noted ending about the proximal right atrium.  IMPRESSION: Minimal bibasilar atelectasis noted;  lungs otherwise clear.   Electronically Signed   By: JGarald BaldingM.D.   On: 12/23/2013 01:33     EKG: Independently reviewed. Sinus tachycardia  ASSESSMENT AND PLAN: Present on Admission:  . Asthma exacerbation:already improved after nebulized bronchodilators and IV steroids given in the emergency room. We'll continue with IV Solu-Medrol, scheduled nebulized dilators and empiric Levaquin.CT angiogram chest negative for Pulmonary embolism  . Leukocytosis:no signs of infection apparent, CTA chest negative for pneumonia, UA negative for UTI. Suspect this is secondary to recent Neupogen injection. Monitor off antibiotics.  . Rheumatoid arthritis:hold rheumatoid arthritis.  . Lymphoma: Port-A-Cath in place. Last chemotherapy 2 weeks back. Per  daughter next chemotherapy scheduled on 12/14 in [place],, [place].  Further plan will depend as patient's clinical course evolves and further radiologic and laboratory data become available. Patient will be monitored closely.  Above noted plan was discussed with patient/family, they were in agreement.   DVT Prophylaxis: Prophylactic Lovenox   Code Status: Full Code  Disposition Plan: Home in 1-2 days    Total time spent for admission equals 45 minutes.  [facility] Pager [phone]  If 7PM-7AM, please contact night-coverage [email or web address] Password [facility] 12/23/2013, 8:37 AM

## 2013-12-23 NOTE — Progress Notes (Signed)
ANTIBIOTIC CONSULT NOTE - INITIAL  Pharmacy Consult for Levofloxacin Indication: R/O Sepsis  Allergies  Allergen Reactions  . Aspirin Other (See Comments)    Blood sugar goes up  . Prednisone Other (See Comments)    Blood sugar goes up    Patient Measurements: Height: 5\' 4"  (162.6 cm) Weight: 153 lb 1.6 oz (69.446 kg) IBW/kg (Calculated) : 54.7  Vital Signs: Temp: 98.5 F (36.9 C) (11/28 0822) Temp Source: Oral (11/28 0822) BP: 106/59 mmHg (11/28 0822) Pulse Rate: 113 (11/28 0822) Intake/Output from previous day: 11/27 0701 - 11/28 0700 In: 440 [P.O.:240; I.V.:150; IV Piggyback:50] Out: -  Intake/Output from this shift: Total I/O In: 1490 [P.O.:240; I.V.:1050; IV Piggyback:200] Out: -   Labs:  Recent Labs  12/22/13 2321  WBC 23.7*  HGB 10.2*  PLT 251  CREATININE 0.71   Estimated Creatinine Clearance: 72.4 mL/min (by C-G formula based on Cr of 0.71). No results for input(s): VANCOTROUGH, VANCOPEAK, VANCORANDOM, GENTTROUGH, GENTPEAK, GENTRANDOM, TOBRATROUGH, TOBRAPEAK, TOBRARND, AMIKACINPEAK, AMIKACINTROU, AMIKACIN in the last 72 hours.   Microbiology: No results found for this or any previous visit (from the past 720 hour(s)).  Medical History: Past Medical History  Diagnosis Date  . Hypertension   . Type II diabetes mellitus   . Chronic bronchitis     "get it q yr" (09/08/2013)  . Pneumonia 2013; 2014; 2015  . Asthma   . Asthmatic bronchitis , chronic   . Anemia   . Stroke "~ 04/2013    "memory problems; weak all over since" (09/08/2013)  . Arthritis     "all my body"  . Memory problem     Medications:  Scheduled:  . enoxaparin (LOVENOX) injection  40 mg Subcutaneous Q24H  . folic acid  1 mg Oral Daily  . insulin aspart  0-15 Units Subcutaneous TID WC  . insulin detemir  10 Units Subcutaneous QPM  . levofloxacin (LEVAQUIN) IV  750 mg Intravenous Q24H  . methylPREDNISolone (SOLU-MEDROL) injection  40 mg Intravenous 3 times per day  . sodium  chloride  3 mL Intravenous Q12H   Infusions:  . sodium chloride     Assessment: Bailey Moss is an 58 y.o. female admitted on 12/22/2013 presenting with SOB.  Recent port-a-cath inserted for chemotherapy for lymphoma.  Patient meets SIRS 2/4 criteria (HR 110s, WBC 23.7), but is afebrile and UA unremarkable.  CXR reveals bibasilar atelectasis, otherwise lungs clear. Renal function appears to be stable with scr 0.71, CrCl ~72 mL/min. Pharmacy has been consulted to dose levofloxacin for r/o sepsis.    Day #1 ABX Levofloxacin 11/28 >> Vanc 11/28 >> Zosyn 11/28 x 1  11/28 Bld x 2 >>  Plan:  - Initiate levofloxacin 750 mg daily - Monitor renal function, clinical efficacy, and vancomycin trough levels when appropriate  Hassie Bruce, Pharm. D. Clinical Pharmacy Resident Pager: [phone] Ph: [phone] 12/23/2013 9:35 AM

## 2013-12-23 NOTE — ED Notes (Signed)
In and out cath done to obtain clean urine.  Patient is constantly urinating, has a strong smell of urine.  Cath done per policy.

## 2013-12-24 DIAGNOSIS — D72829 Elevated white blood cell count, unspecified: Secondary | ICD-10-CM

## 2013-12-24 LAB — CBC
HEMATOCRIT: 27.5 % — AB (ref 36.0–46.0)
HEMOGLOBIN: 9 g/dL — AB (ref 12.0–15.0)
MCH: 30.6 pg (ref 26.0–34.0)
MCHC: 32.7 g/dL (ref 30.0–36.0)
MCV: 93.5 fL (ref 78.0–100.0)
Platelets: 192 10*3/uL (ref 150–400)
RBC: 2.94 MIL/uL — ABNORMAL LOW (ref 3.87–5.11)
RDW: 15 % (ref 11.5–15.5)
WBC: 25.2 10*3/uL — ABNORMAL HIGH (ref 4.0–10.5)

## 2013-12-24 LAB — GLUCOSE, CAPILLARY
GLUCOSE-CAPILLARY: 263 mg/dL — AB (ref 70–99)
GLUCOSE-CAPILLARY: 269 mg/dL — AB (ref 70–99)
GLUCOSE-CAPILLARY: 235 mg/dL — AB (ref 70–99)
Glucose-Capillary: 386 mg/dL — ABNORMAL HIGH (ref 70–99)

## 2013-12-24 LAB — BASIC METABOLIC PANEL
Anion gap: 17 — ABNORMAL HIGH (ref 5–15)
BUN: 17 mg/dL (ref 6–23)
CO2: 19 meq/L (ref 19–32)
CREATININE: 0.64 mg/dL (ref 0.50–1.10)
Calcium: 8.9 mg/dL (ref 8.4–10.5)
Chloride: 100 mEq/L (ref 96–112)
GFR calc Af Amer: >90 mL/min (ref >90)
GFR calc non Af Amer: >90 mL/min (ref >90)
Glucose, Bld: 303 mg/dL — ABNORMAL HIGH (ref 70–99)
POTASSIUM: 4.3 meq/L (ref 3.7–5.3)
Sodium: 136 mEq/L — ABNORMAL LOW (ref 137–147)

## 2013-12-24 MED ORDER — GLIMEPIRIDE 4 MG PO TABS
4.0000 mg | ORAL_TABLET | Freq: Every day | ORAL | Status: DC
  Administered 2013-12-24 – 2013-12-25 (×2): 4 mg via ORAL
  Filled 2013-12-24 (×3): qty 1

## 2013-12-24 MED ORDER — METHYLPREDNISOLONE SODIUM SUCC 40 MG IJ SOLR
40.0000 mg | Freq: Two times a day (BID) | INTRAMUSCULAR | Status: DC
  Administered 2013-12-24 – 2013-12-25 (×2): 40 mg via INTRAVENOUS
  Filled 2013-12-24 (×4): qty 1

## 2013-12-24 MED ORDER — LEVOFLOXACIN 500 MG PO TABS
500.0000 mg | ORAL_TABLET | Freq: Every day | ORAL | Status: DC
  Administered 2013-12-24 – 2013-12-25 (×2): 500 mg via ORAL
  Filled 2013-12-24 (×2): qty 1

## 2013-12-24 MED ORDER — GLIMEPIRIDE 4 MG PO TABS: 4.0000 mg | ORAL_TABLET | Freq: Every day | ORAL | Status: DC

## 2013-12-24 NOTE — Plan of Care (Signed)
Problem: Phase II Progression Outcomes Goal: Progress activity as tolerated unless otherwise ordered Outcome: Progressing     

## 2013-12-24 NOTE — Plan of Care (Signed)
Problem: Phase II Progression Outcomes Goal: IV changed to normal saline lock Outcome: Completed/Met Date Met:  12/24/13

## 2013-12-24 NOTE — Plan of Care (Signed)
Problem: Phase II Progression Outcomes Goal: Obtain order to discontinue catheter if appropriate Outcome: Not Applicable Date Met:  12/24/13     

## 2013-12-24 NOTE — Plan of Care (Signed)
Problem: Phase II Progression Outcomes Goal: Discharge plan established Outcome: Progressing     

## 2013-12-24 NOTE — Plan of Care (Signed)
Problem: Phase II Progression Outcomes Goal: Pain controlled Outcome: Completed/Met Date Met:  12/24/13 Goal: Tolerating diet Outcome: Completed/Met Date Met:  12/24/13 Goal: IV converted to [facility] or NSL Outcome: Completed/Met Date Met:  12/24/13 Goal: Adequate urine output Outcome: Completed/Met Date Met:  12/24/13

## 2013-12-24 NOTE — Progress Notes (Signed)
[facility] Progress Note   Bailey Moss JKK:[id number] DOB: 15-Oct-1955 DOA: 12/22/2013 PCP: Honor Junes, MD  Brief narrative: Bailey Moss is a 58 y.o. female with PMH of Lymphoma s/p last chemo 2 wks ago, asthmatic bronchitis, HTN, DM2 presenting with dyspnea for past couple of days after running out of her bronchodilators.    Subjective: Mild dry cough productive of clear sputum.   Assessment/Plan: Principal Problem:   Asthma exacerbation - significant improvement- begin to taper steroids- change Levaquin to oral   Active Problems: Leukocytosis - possibly due to recent Neupogen - follow    DM (diabetes mellitus) - Levemir increased but sugars still high- resume Amaryl    Rheumatoid arthritis - on Methotrexate at home - stable    Lymphoma - next chemo on 12/14    Code Status: Full code Family Communication: daughter at bedside Disposition Plan: home DVT prophylaxis: Lovenox  Consultants: none  Procedures: none  Antibiotics: Anti-infectives    Start     Dose/Rate Route Frequency Ordered Stop   12/23/13 1800  vancomycin (VANCOCIN) IVPB 1000 mg/200 mL premix  Status:  Discontinued     1,000 mg200 mL/hr over 60 Minutes Intravenous Every 12 hours 12/23/13 0636 12/23/13 0858   12/23/13 1030  levofloxacin (LEVAQUIN) IVPB 750 mg  Status:  Discontinued     750 mg100 mL/hr over 90 Minutes Intravenous Every 24 hours 12/23/13 0927 12/24/13 0833   12/23/13 0630  piperacillin-tazobactam (ZOSYN) IVPB 3.375 g     3.375 g100 mL/hr over 30 Minutes Intravenous  Once 12/23/13 0617 12/23/13 0700   12/23/13 0630  vancomycin (VANCOCIN) IVPB 1000 mg/200 mL premix     1,000 mg200 mL/hr over 60 Minutes Intravenous  Once 12/23/13 0626 12/23/13 0806         Objective: Filed Weights   12/23/13 0822  Weight: 69.446 kg (153 lb 1.6 oz)    Intake/Output Summary (Last 24 hours) at 12/24/13 1058 Last data filed at 12/24/13 1017  Gross per 24 hour  Intake    1100 ml  Output   2050 ml  Net   -950 ml     Vitals Filed Vitals:   12/23/13 2142 12/24/13 0124 12/24/13 0621 12/24/13 0736  BP: 120/59  112/60   Pulse: 102  92   Temp: 98 F (36.7 C)  98 F (36.7 C)   TempSrc: Oral  Oral   Resp: 16  16   Height:      Weight:      SpO2: 98% 100% 96% 95%    Exam: General: No acute respiratory distress Lungs: Clear to auscultation bilaterally without wheezes or crackles Cardiovascular: Regular rate and rhythm without murmur gallop or rub normal S1 and S2 Abdomen: Nontender, nondistended, soft, bowel sounds positive, no rebound, no ascites, no appreciable mass Extremities: No significant cyanosis, clubbing, or edema bilateral lower extremities  Data Reviewed: Basic Metabolic Panel:  Recent Labs Lab 12/22/13 2321 12/24/13 0427  NA 136* 136*  K 4.6 4.3  CL 97 100  CO2 25 19  GLUCOSE 117* 303*  BUN 10 17  CREATININE 0.71 0.64  CALCIUM 9.3 8.9   Liver Function Tests: No results for input(s): AST, ALT, ALKPHOS, BILITOT, PROT, ALBUMIN in the last 168 hours. No results for input(s): LIPASE, AMYLASE in the last 168 hours. No results for input(s): AMMONIA in the last 168 hours. CBC:  Recent Labs Lab 12/22/13 2321 12/24/13 0427  WBC 23.7* 25.2*  HGB 10.2* 9.0*  HCT 31.9* 27.5*  MCV 93.0  93.5  PLT 251 192   Cardiac Enzymes:  Recent Labs Lab 12/22/13 2321  TROPONINI <0.30   BNP (last 3 results)  Recent Labs  12/22/13 2321  PROBNP 76.3   CBG:  Recent Labs Lab 12/23/13 1142 12/23/13 1423 12/23/13 1645 12/23/13 2250 12/24/13 0754  GLUCAP 392* 414* 380* 301* 263*    No results found for this or any previous visit (from the past 240 hour(s)).   Studies:  Recent x-ray studies have been reviewed in detail by the Attending Physician  Scheduled Meds:  Scheduled Meds: . enoxaparin (LOVENOX) injection  40 mg Subcutaneous Q24H  . folic acid  1 mg Oral Daily  . glimepiride  4 mg Oral Q breakfast  . insulin aspart   0-20 Units Subcutaneous TID WC  . insulin aspart  0-5 Units Subcutaneous QHS  . insulin detemir  15 Units Subcutaneous QPM  . ipratropium-albuterol  3 mL Nebulization Q6H  . methylPREDNISolone (SOLU-MEDROL) injection  40 mg Intravenous Q12H  . sodium chloride  3 mL Intravenous Q12H   Continuous Infusions:   Time spent on care of this patient: 35 min   Nason, MD 12/24/2013, 10:58 AM  LOS: 2 days   [facility] Office  [phone] Pager - Text Page per [email or web address]  If 7PM-7AM, please contact night-coverage [email or web address]

## 2013-12-24 NOTE — Plan of Care (Signed)
Problem: Consults Goal: General Medical Patient Education See Patient Education Module for specific education.  Outcome: Completed/Met Date Met:  12/24/13 Goal: Skin Care Protocol Initiated - if Braden Score 18 or less If consults are not indicated, leave blank or document N/A  Outcome: Completed/Met Date Met:  12/24/13 Goal: Nutrition Consult-if indicated Outcome: Not Applicable Date Met:  37/90/24 Goal: Diabetes Guidelines if Diabetic/Glucose > 140 If diabetic or lab glucose is > 140 mg/dl - Initiate Diabetes/Hyperglycemia Guidelines & Document Interventions  Outcome: Completed/Met Date Met:  12/24/13  Problem: Phase I Progression Outcomes Goal: Pain controlled with appropriate interventions Outcome: Completed/Met Date Met:  12/24/13 Goal: OOB as tolerated unless otherwise ordered Outcome: Completed/Met Date Met:  12/24/13 Goal: Voiding-avoid urinary catheter unless indicated Outcome: Completed/Met Date Met:  12/24/13 Goal: Hemodynamically stable Outcome: Completed/Met Date Met:  12/24/13

## 2013-12-25 DIAGNOSIS — D72829 Elevated white blood cell count, unspecified: Secondary | ICD-10-CM

## 2013-12-25 DIAGNOSIS — A419 Sepsis, unspecified organism: Secondary | ICD-10-CM

## 2013-12-25 LAB — GLUCOSE, CAPILLARY
Glucose-Capillary: 116 mg/dL — ABNORMAL HIGH (ref 70–99)
Glucose-Capillary: 389 mg/dL — ABNORMAL HIGH (ref 70–99)

## 2013-12-25 MED ORDER — PREDNISONE 20 MG PO TABS
40.0000 mg | ORAL_TABLET | Freq: Every day | ORAL | Status: DC
  Administered 2013-12-25: 40 mg via ORAL
  Filled 2013-12-25 (×2): qty 2

## 2013-12-25 MED ORDER — MUCUS-DM 30-600 MG PO TB12: 1.0000 | ORAL_TABLET | Freq: Two times a day (BID) | ORAL | Status: AC | PRN

## 2013-12-25 MED ORDER — LEVOFLOXACIN 500 MG PO TABS: 500.0000 mg | ORAL_TABLET | Freq: Every day | ORAL | Status: DC

## 2013-12-25 MED ORDER — PREDNISONE 10 MG PO TABS: ORAL_TABLET | ORAL | Status: DC

## 2013-12-25 NOTE — Discharge Summary (Addendum)
Physician Discharge Summary  Bailey Moss JQG:[id number] DOB: 1955-08-19 DOA: 12/22/2013  PCP: Honor Junes, MD  Admit date: 12/22/2013 Discharge date: 12/25/2013  Time spent: >45 minutes  Outpatient follow-up: -Will need to repeat CBC in 1 week to ensure that WBC count has improved   Discharge Condition: Stable Diet recommendation: Heart healthy and diabetic diet  Discharge Diagnoses:  Principal Problem:   Asthma exacerbation Active Problems:   Leukocytosis   DM (diabetes mellitus)   Rheumatoid arthritis   SIRS (systemic inflammatory response syndrome)   Lymphoma   History of present illness:  Bailey Moss is a 58 y.o. female with PMH of Lymphoma s/p last chemo 2 wks ago, asthmatic bronchitis, HTN, DM2 presenting with dyspnea for past couple of days after running out of her bronchodilators. Please see history of present illness for further details regarding admission.  Hospital Course:  Principal Problem:  Asthma exacerbation -  due to running out of Albuterol inhaler and neb treatments -  Rapid improvement noted after resumption of nebulizer treatments and initiation of IV steroids-have been tapering steroids - she has remained stable and is ready to be discharged home today -  Levaquin started on admission to treat any underlying infection which may have prompted the exacerbation-will change Levaquin to oral and complete a 5 day course  Active Problems: Leukocytosis - possibly due to recent Neupogen - complete 5 day course of Levaquin and complete steroids and repeat CBC in 1wk   DM (diabetes mellitus) - Levemir increased slightly to counter balanced elevated sugars which are secondary to steroids -Continue Amaryl-she is not on any other insulin at home   Rheumatoid arthritis - on Methotrexate at home - stable   Lymphoma - next chemo on 12/14  Procedures:  None  Consultations:  None  Discharge Exam: Filed Weights   12/23/13 0822   Weight: 69.446 kg (153 lb 1.6 oz)   Filed Vitals:   12/25/13 0638  BP: 148/66  Pulse: 73  Temp: 97.7 F (36.5 C)  Resp: 16    General: AAO x 3, no distress Cardiovascular: RRR, no murmurs  Respiratory: clear to auscultation bilaterally-oxygen saturation is 99% on room air GI: soft, non-tender, non-distended, bowel sound positive  Discharge Instructions You were cared for by a hospitalist during your hospital stay. If you have any questions about your discharge medications or the care you received while you were in the hospital after you are discharged, you can call the unit and asked to speak with the hospitalist on call if the hospitalist that took care of you is not available. Once you are discharged, your primary care physician will handle any further medical issues. Please note that NO REFILLS for any discharge medications will be authorized once you are discharged, as it is imperative that you return to your primary care physician (or establish a relationship with a primary care physician if you do not have one) for your aftercare needs so that they can reassess your need for medications and monitor your lab values.      Discharge Instructions    Diet - low sodium heart healthy    Complete by:  As directed   And diabetic diet     Increase activity slowly    Complete by:  As directed             Medication List    TAKE these medications        albuterol (2.5 MG/3ML) 0.083% nebulizer solution  Commonly known as:  PROVENTIL  Take 2.5 mg by nebulization every 6 (six) hours as needed for wheezing or shortness of breath.     albuterol 108 (90 BASE) MCG/ACT inhaler  Commonly known as:  PROVENTIL HFA;VENTOLIN HFA  Inhale 2 puffs into the lungs every 6 (six) hours as needed for wheezing or shortness of breath.     AUTO-LANCET Misc  Use as directed     Fluticasone-Salmeterol 500-50 MCG/DOSE Aepb  Commonly known as:  ADVAIR  Inhale 1 puff into the lungs 2 (two) times  daily.     folic acid 1 MG tablet  Commonly known as:  FOLVITE  Take 1 mg by mouth daily.     gentamicin 0.3 % ophthalmic solution  Commonly known as:  GARAMYCIN  Place 1 drop into both eyes 2 (two) times daily.     glimepiride 4 MG tablet  Commonly known as:  AMARYL  Take 4 mg by mouth daily with breakfast.     glucose blood test strip  Use as instructed     glucose monitoring kit monitoring kit  1 each by Does not apply route as needed for other.     LEVEMIR FLEXTOUCH 100 UNIT/ML Pen  Generic drug:  Insulin Detemir  Inject 10 Units into the skin every evening.     levofloxacin 500 MG tablet  Commonly known as:  LEVAQUIN  Take 1 tablet (500 mg total) by mouth daily.  Start taking on:  12/27/2014     metFORMIN 1000 MG tablet  Commonly known as:  GLUCOPHAGE  Take 1 tablet (1,000 mg total) by mouth 2 (two) times daily with a meal.     methotrexate 2.5 MG tablet  Commonly known as:  RHEUMATREX  Take 7.5 mg by mouth once a week.     MUCUS-DM 30-600 MG Tb12  Take 1 tablet by mouth every 12 (twelve) hours as needed (Cough and congestion).     predniSONE 10 MG tablet  Commonly known as:  DELTASONE  Take 30 mg tomorrow, 20 the following day and 10 the following day     Vitamin D (Ergocalciferol) 50000 UNITS Caps capsule  Commonly known as:  DRISDOL  Take 50,000 Units by mouth 3 (three) times a week. On Monday, Wednesday and Friday.       Allergies  Allergen Reactions  . Aspirin Other (See Comments)    Blood sugar goes up   Follow-up Information    Follow up with SCHLEUPNER, MARK A, MD In 1 week.   Specialty:  Internal Medicine   Why:  You will need to return in 1 week and have blood work done (CBC) to ensure WBC count has improved   Contact information:   [address] [place] [address] [phone]        The results of significant diagnostics from this hospitalization (including imaging, microbiology, ancillary and laboratory) are listed below for  reference.    Significant Diagnostic Studies: Ct Angio Chest Pe W/cm &/or Wo Cm  12/23/2013   CLINICAL DATA:  Acute onset of shortness of breath, with productive cough. Initial encounter.  EXAM: CT ANGIOGRAPHY CHEST WITH CONTRAST  TECHNIQUE: Multidetector CT imaging of the chest was performed using the standard protocol during bolus administration of intravenous contrast. Multiplanar CT image reconstructions and MIPs were obtained to evaluate the vascular anatomy.  CONTRAST:  18mL OMNIPAQUE IOHEXOL 350 MG/ML SOLN  COMPARISON:  Chest radiograph performed earlier today at 12:46 a.m.  FINDINGS: There is no evidence of pulmonary embolus.  Bibasilar atelectasis is  noted. The lungs are otherwise clear. There is no evidence of significant focal consolidation, pleural effusion or pneumothorax. No masses are identified; no abnormal focal contrast enhancement is seen.  The mediastinum is unremarkable in appearance. No mediastinal lymphadenopathy is seen. No pericardial effusion is identified. The great vessels are grossly unremarkable in appearance. A right-sided chest port is noted ending within the right atrium.  Left axillary lymphadenopathy is improved from the prior study. Previously noted enlarged nodes at the left upper quadrant are partially seen. The visualized portions of the thyroid gland are unremarkable in appearance.  The visualized portions of the liver and spleen are unremarkable.  No acute osseous abnormalities are seen.  Review of the MIP images confirms the above findings.  IMPRESSION: 1. No evidence of pulmonary embolus. 2. Bibasilar atelectasis noted; lungs otherwise clear. 3. Interval improvement in left axillary lymphadenopathy; enlarged node again noted at the left upper quadrant.   Electronically Signed   By: Garald Balding M.D.   On: 12/23/2013 04:52   Dg Chest Port 1 View  12/23/2013   CLINICAL DATA:  Acute onset of shortness of breath and dyspnea. Follow-up study.  EXAM: PORTABLE CHEST -  1 VIEW  COMPARISON:  Chest radiograph performed earlier today at 12:46 a.m., and CTA of the chest performed earlier today at 4:26 a.m.  FINDINGS: The lungs are well-aerated. Mild vascular congestion is noted. Mildly increased interstitial markings are noted, new from the prior study, raising question for mild interstitial edema. No pleural effusion or pneumothorax is seen.  The cardiomediastinal silhouette is normal in size. A right-sided chest port is noted ending about the cavoatrial junction. No acute osseous abnormalities are seen.  IMPRESSION: Vascular congestion noted. Mildly interstitial markings are new from the prior study and raise question for mild acute interstitial edema.   Electronically Signed   By: Garald Balding M.D.   On: 12/23/2013 07:03   Dg Chest Port 1 View  12/23/2013   CLINICAL DATA:  Acute onset of moderate shortness of breath, with productive cough. Initial encounter.  EXAM: PORTABLE CHEST - 1 VIEW  COMPARISON:  Chest radiograph and CTA of the chest performed 09/08/2013  FINDINGS: The lungs are well-aerated. Minimal bibasilar atelectasis is noted. There is no evidence of pleural effusion or pneumothorax.  The cardiomediastinal silhouette is within normal limits. No acute osseous abnormalities are seen. A right-sided chest port is noted ending about the proximal right atrium.  IMPRESSION: Minimal bibasilar atelectasis noted; lungs otherwise clear.   Electronically Signed   By: Garald Balding M.D.   On: 12/23/2013 01:33    Microbiology: Recent Results (from the past 240 hour(s))  Blood culture (routine x 2)     Status: None (Preliminary result)   Collection Time: 12/23/13  6:25 AM  Result Value Ref Range Status   Specimen Description BLOOD LEFT ARM  Final   Special Requests BOTTLES DRAWN AEROBIC ONLY 10CC  Final   Culture  Setup Time   Final    12/23/2013 17:55 Performed at [facility]    Culture   Final           BLOOD CULTURE RECEIVED NO GROWTH TO DATE CULTURE  WILL BE HELD FOR 5 DAYS BEFORE ISSUING A FINAL NEGATIVE REPORT Note: Culture results may be compromised due to an excessive volume of blood received in culture bottles. Performed at [facility]    Report Status PENDING  Incomplete  Blood culture (routine x 2)     Status: None (Preliminary  result)   Collection Time: 12/23/13  6:30 AM  Result Value Ref Range Status   Specimen Description BLOOD LEFT HAND  Final   Special Requests BOTTLES DRAWN AEROBIC AND ANAEROBIC 10CC  Final   Culture  Setup Time   Final    12/23/2013 17:55 Performed at [facility]    Culture   Final           BLOOD CULTURE RECEIVED NO GROWTH TO DATE CULTURE WILL BE HELD FOR 5 DAYS BEFORE ISSUING A FINAL NEGATIVE REPORT Note: Culture results may be compromised due to an excessive volume of blood received in culture bottles. Performed at [facility]    Report Status PENDING  Incomplete     Labs: Basic Metabolic Panel:  Recent Labs Lab 12/22/13 2321 12/24/13 0427  NA 136* 136*  K 4.6 4.3  CL 97 100  CO2 25 19  GLUCOSE 117* 303*  BUN 10 17  CREATININE 0.71 0.64  CALCIUM 9.3 8.9   Liver Function Tests: No results for input(s): AST, ALT, ALKPHOS, BILITOT, PROT, ALBUMIN in the last 168 hours. No results for input(s): LIPASE, AMYLASE in the last 168 hours. No results for input(s): AMMONIA in the last 168 hours. CBC:  Recent Labs Lab 12/22/13 2321 12/24/13 0427  WBC 23.7* 25.2*  HGB 10.2* 9.0*  HCT 31.9* 27.5*  MCV 93.0 93.5  PLT 251 192   Cardiac Enzymes:  Recent Labs Lab 12/22/13 2321  TROPONINI <0.30   BNP: BNP (last 3 results)  Recent Labs  12/22/13 2321  PROBNP 76.3   CBG:  Recent Labs Lab 12/24/13 0754 12/24/13 1155 12/24/13 1647 12/24/13 2130 12/25/13 0749  GLUCAP 263* 386* 269* 235* 116*       SignedDebbe Odea, MD [facility] 12/25/2013, 11:51 AM

## 2013-12-25 NOTE — Progress Notes (Signed)
Utilization Review Completed.Bailey Moss, Bailey Moss T11/30/2015

## 2013-12-25 NOTE — Progress Notes (Signed)
Pt given discharge instructions, prescriptions and PIV removed.  Pt denied any needs at this time.  Pt taken to discharge location via wheelchair.

## 2013-12-25 NOTE — Progress Notes (Signed)
Pt ambulated with nurse tech in hallway about 76ft without oxygen.  Pt did not complain of any dizziness or SOB.  Oxygen saturations maintained at 92%.  Will continue to monitor.

## 2013-12-25 NOTE — Care Management Note (Signed)
    Page 1 of 1   12/25/2013     4:20:49 PM CARE MANAGEMENT NOTE 12/25/2013  Patient:  Bailey Moss, Bailey Moss   Account Number:  [id number]  Date Initiated:  12/25/2013  Documentation initiated by:  Tomi Bamberger  Subjective/Objective Assessment:   dx sirs  admit- from home.     Action/Plan:   Anticipated DC Date:  12/25/2013   Anticipated DC Plan:  [place]  CM consult      Choice offered to / List presented to:             Status of service:  Completed, signed off Medicare Important Message given?  NO (If response is "NO", the following Medicare IM given date fields will be blank) Date Medicare IM given:   Medicare IM given by:   Date Additional Medicare IM given:   Additional Medicare IM given by:    Discharge Disposition:  HOME/SELF CARE  Per UR Regulation:  Reviewed for med. necessity/level of care/duration of stay  If discussed at [place] of Stay Meetings, dates discussed:    Comments:  12/25/13 [place] ,BSN [phone] no needs anticipated.

## 2013-12-29 LAB — CULTURE, BLOOD (ROUTINE X 2)
CULTURE: NO GROWTH
Culture: NO GROWTH

## 2014-01-05 ENCOUNTER — Emergency Department (HOSPITAL_COMMUNITY): Payer: Federal, State, Local not specified - PPO

## 2014-01-05 ENCOUNTER — Encounter (HOSPITAL_COMMUNITY): Payer: Self-pay | Admitting: *Deleted

## 2014-01-05 ENCOUNTER — Inpatient Hospital Stay (HOSPITAL_COMMUNITY)
Admission: EM | Admit: 2014-01-05 | Discharge: 2014-01-11 | DRG: 975 | Disposition: A | Discharge status: Home / Self Care | Payer: Federal, State, Local not specified - PPO | Attending: Internal Medicine | Admitting: Internal Medicine

## 2014-01-05 DIAGNOSIS — Z7951 Long term (current) use of inhaled steroids: Secondary | ICD-10-CM | POA: Diagnosis not present

## 2014-01-05 DIAGNOSIS — J441 Chronic obstructive pulmonary disease with (acute) exacerbation: Secondary | ICD-10-CM | POA: Diagnosis present

## 2014-01-05 DIAGNOSIS — B59 Pneumocystosis: Secondary | ICD-10-CM | POA: Insufficient documentation

## 2014-01-05 DIAGNOSIS — R05 Cough: Secondary | ICD-10-CM

## 2014-01-05 DIAGNOSIS — Z961 Presence of intraocular lens: Secondary | ICD-10-CM | POA: Diagnosis present

## 2014-01-05 DIAGNOSIS — B20 Human immunodeficiency virus [HIV] disease: Secondary | ICD-10-CM | POA: Diagnosis present

## 2014-01-05 DIAGNOSIS — M069 Rheumatoid arthritis, unspecified: Secondary | ICD-10-CM | POA: Diagnosis present

## 2014-01-05 DIAGNOSIS — E1165 Type 2 diabetes mellitus with hyperglycemia: Secondary | ICD-10-CM | POA: Diagnosis present

## 2014-01-05 DIAGNOSIS — A419 Sepsis, unspecified organism: Secondary | ICD-10-CM | POA: Diagnosis present

## 2014-01-05 DIAGNOSIS — Z9841 Cataract extraction status, right eye: Secondary | ICD-10-CM

## 2014-01-05 DIAGNOSIS — D649 Anemia, unspecified: Secondary | ICD-10-CM | POA: Diagnosis present

## 2014-01-05 DIAGNOSIS — K5900 Constipation, unspecified: Secondary | ICD-10-CM | POA: Diagnosis present

## 2014-01-05 DIAGNOSIS — J440 Chronic obstructive pulmonary disease with acute lower respiratory infection: Secondary | ICD-10-CM | POA: Diagnosis present

## 2014-01-05 DIAGNOSIS — E875 Hyperkalemia: Secondary | ICD-10-CM | POA: Diagnosis not present

## 2014-01-05 DIAGNOSIS — Z794 Long term (current) use of insulin: Secondary | ICD-10-CM

## 2014-01-05 DIAGNOSIS — Z9842 Cataract extraction status, left eye: Secondary | ICD-10-CM | POA: Diagnosis not present

## 2014-01-05 DIAGNOSIS — R531 Weakness: Secondary | ICD-10-CM

## 2014-01-05 DIAGNOSIS — D6489 Other specified anemias: Secondary | ICD-10-CM | POA: Diagnosis present

## 2014-01-05 DIAGNOSIS — J189 Pneumonia, unspecified organism: Secondary | ICD-10-CM | POA: Diagnosis not present

## 2014-01-05 DIAGNOSIS — C8510 Unspecified B-cell lymphoma, unspecified site: Secondary | ICD-10-CM | POA: Diagnosis present

## 2014-01-05 DIAGNOSIS — D899 Disorder involving the immune mechanism, unspecified: Secondary | ICD-10-CM

## 2014-01-05 DIAGNOSIS — D849 Immunodeficiency, unspecified: Secondary | ICD-10-CM

## 2014-01-05 DIAGNOSIS — J45909 Unspecified asthma, uncomplicated: Secondary | ICD-10-CM | POA: Diagnosis present

## 2014-01-05 DIAGNOSIS — Z886 Allergy status to analgesic agent status: Secondary | ICD-10-CM

## 2014-01-05 DIAGNOSIS — Y95 Nosocomial condition: Secondary | ICD-10-CM | POA: Diagnosis present

## 2014-01-05 DIAGNOSIS — R059 Cough, unspecified: Secondary | ICD-10-CM

## 2014-01-05 DIAGNOSIS — Z888 Allergy status to other drugs, medicaments and biological substances status: Secondary | ICD-10-CM | POA: Diagnosis not present

## 2014-01-05 DIAGNOSIS — R509 Fever, unspecified: Secondary | ICD-10-CM

## 2014-01-05 DIAGNOSIS — C8590 Non-Hodgkin lymphoma, unspecified, unspecified site: Secondary | ICD-10-CM | POA: Diagnosis present

## 2014-01-05 DIAGNOSIS — E119 Type 2 diabetes mellitus without complications: Secondary | ICD-10-CM

## 2014-01-05 DIAGNOSIS — T370X5A Adverse effect of sulfonamides, initial encounter: Secondary | ICD-10-CM | POA: Diagnosis present

## 2014-01-05 DIAGNOSIS — Z79899 Other long term (current) drug therapy: Secondary | ICD-10-CM

## 2014-01-05 DIAGNOSIS — Z8673 Personal history of transient ischemic attack (TIA), and cerebral infarction without residual deficits: Secondary | ICD-10-CM

## 2014-01-05 DIAGNOSIS — I10 Essential (primary) hypertension: Secondary | ICD-10-CM | POA: Diagnosis present

## 2014-01-05 DIAGNOSIS — E08319 Diabetes mellitus due to underlying condition with unspecified diabetic retinopathy without macular edema: Secondary | ICD-10-CM

## 2014-01-05 HISTORY — DX: Malignant (primary) neoplasm, unspecified: C80.1

## 2014-01-05 LAB — COMPREHENSIVE METABOLIC PANEL
ALT: 41 U/L — ABNORMAL HIGH (ref 0–35)
AST: 50 U/L — AB (ref 0–37)
Albumin: 2.6 g/dL — ABNORMAL LOW (ref 3.5–5.2)
Alkaline Phosphatase: 103 U/L (ref 39–117)
Anion gap: 17 — ABNORMAL HIGH (ref 5–15)
BILIRUBIN TOTAL: 0.5 mg/dL (ref 0.3–1.2)
BUN: 20 mg/dL (ref 6–23)
CHLORIDE: 91 meq/L — AB (ref 96–112)
CO2: 20 mEq/L (ref 19–32)
Calcium: 8.9 mg/dL (ref 8.4–10.5)
Creatinine, Ser: 0.96 mg/dL (ref 0.50–1.10)
GFR, EST AFRICAN AMERICAN: 73 mL/min — AB (ref >90)
GFR, EST NON AFRICAN AMERICAN: 63 mL/min — AB (ref >90)
GLUCOSE: 222 mg/dL — AB (ref 70–99)
Potassium: 4.4 mEq/L (ref 3.7–5.3)
Sodium: 128 mEq/L — ABNORMAL LOW (ref 137–147)
Total Protein: 6.8 g/dL (ref 6.0–8.3)

## 2014-01-05 LAB — I-STAT CG4 LACTIC ACID, ED: Lactic Acid, Venous: 1.65 mmol/L (ref 0.5–2.2)

## 2014-01-05 LAB — CBC WITH DIFFERENTIAL/PLATELET
Basophils Absolute: 0 10*3/uL (ref 0.0–0.1)
Basophils Relative: 0 % (ref 0–1)
Eosinophils Absolute: 1.5 10*3/uL — ABNORMAL HIGH (ref 0.0–0.7)
Eosinophils Relative: 18 % — ABNORMAL HIGH (ref 0–5)
HCT: 28.1 % — ABNORMAL LOW (ref 36.0–46.0)
Hemoglobin: 9.6 g/dL — ABNORMAL LOW (ref 12.0–15.0)
LYMPHS ABS: 0.7 10*3/uL (ref 0.7–4.0)
LYMPHS PCT: 9 % — AB (ref 12–46)
MCH: 30.9 pg (ref 26.0–34.0)
MCHC: 34.2 g/dL (ref 30.0–36.0)
MCV: 90.4 fL (ref 78.0–100.0)
MONO ABS: 0.4 10*3/uL (ref 0.1–1.0)
Monocytes Relative: 5 % (ref 3–12)
NEUTROS ABS: 5.4 10*3/uL (ref 1.7–7.7)
Neutrophils Relative %: 68 % (ref 43–77)
Platelets: 96 10*3/uL — ABNORMAL LOW (ref 150–400)
RBC: 3.11 MIL/uL — ABNORMAL LOW (ref 3.87–5.11)
RDW: 15.6 % — ABNORMAL HIGH (ref 11.5–15.5)
WBC: 8 10*3/uL (ref 4.0–10.5)

## 2014-01-05 LAB — URINALYSIS, ROUTINE W REFLEX MICROSCOPIC
BILIRUBIN URINE: NEGATIVE
Glucose, UA: NEGATIVE mg/dL
Ketones, ur: NEGATIVE mg/dL
Leukocytes, UA: NEGATIVE
Nitrite: NEGATIVE
Protein, ur: NEGATIVE mg/dL
SPECIFIC GRAVITY, URINE: 1.007 (ref 1.005–1.030)
Urobilinogen, UA: 0.2 mg/dL (ref 0.0–1.0)
pH: 5.5 (ref 5.0–8.0)

## 2014-01-05 LAB — I-STAT TROPONIN, ED: Troponin i, poc: 0.05 ng/mL (ref 0.00–0.08)

## 2014-01-05 LAB — URINE MICROSCOPIC-ADD ON

## 2014-01-05 LAB — TROPONIN I

## 2014-01-05 MED ORDER — SODIUM CHLORIDE 0.9 % IV BOLUS (SEPSIS)
1000.0000 mL | Freq: Once | INTRAVENOUS | Status: AC
  Administered 2014-01-05: 1000 mL via INTRAVENOUS

## 2014-01-05 MED ORDER — IOHEXOL 350 MG/ML SOLN
100.0000 mL | Freq: Once | INTRAVENOUS | Status: AC | PRN
  Administered 2014-01-05: 100 mL via INTRAVENOUS

## 2014-01-05 MED ORDER — INSULIN DETEMIR 100 UNIT/ML ~~LOC~~ SOLN
10.0000 [IU] | Freq: Every evening | SUBCUTANEOUS | Status: DC
  Administered 2014-01-06 – 2014-01-09 (×4): 10 [IU] via SUBCUTANEOUS
  Filled 2014-01-05 (×8): qty 0.1

## 2014-01-05 MED ORDER — ACETAMINOPHEN 325 MG PO TABS
650.0000 mg | ORAL_TABLET | Freq: Four times a day (QID) | ORAL | Status: DC | PRN
  Administered 2014-01-06 – 2014-01-07 (×3): 650 mg via ORAL
  Filled 2014-01-05 (×4): qty 2

## 2014-01-05 MED ORDER — GLIMEPIRIDE 4 MG PO TABS: 4.0000 mg | ORAL_TABLET | Freq: Every day | ORAL | Status: DC

## 2014-01-05 MED ORDER — CEFEPIME HCL 1 G IJ SOLR: 1.0000 g | Freq: Once | INTRAMUSCULAR | Status: DC

## 2014-01-05 MED ORDER — MOMETASONE FURO-FORMOTEROL FUM 200-5 MCG/ACT IN AERO
2.0000 | INHALATION_SPRAY | Freq: Two times a day (BID) | RESPIRATORY_TRACT | Status: DC
  Administered 2014-01-05 – 2014-01-11 (×12): 2 via RESPIRATORY_TRACT
  Filled 2014-01-05: qty 8.8

## 2014-01-05 MED ORDER — INSULIN ASPART 100 UNIT/ML ~~LOC~~ SOLN
0.0000 [IU] | Freq: Three times a day (TID) | SUBCUTANEOUS | Status: DC
  Administered 2014-01-07: 3 [IU] via SUBCUTANEOUS
  Administered 2014-01-08: 5 [IU] via SUBCUTANEOUS
  Administered 2014-01-08: 8 [IU] via SUBCUTANEOUS
  Administered 2014-01-09: 15 [IU] via SUBCUTANEOUS
  Administered 2014-01-09: 5 [IU] via SUBCUTANEOUS
  Administered 2014-01-09: 3 [IU] via SUBCUTANEOUS
  Administered 2014-01-10: 15 [IU] via SUBCUTANEOUS
  Administered 2014-01-10 (×2): 5 [IU] via SUBCUTANEOUS
  Administered 2014-01-11: 11 [IU] via SUBCUTANEOUS
  Administered 2014-01-11: 8 [IU] via SUBCUTANEOUS
  Administered 2014-01-11: 3 [IU] via SUBCUTANEOUS

## 2014-01-05 MED ORDER — VANCOMYCIN HCL IN DEXTROSE 750-5 MG/150ML-% IV SOLN
750.0000 mg | Freq: Two times a day (BID) | INTRAVENOUS | Status: DC
  Administered 2014-01-05 – 2014-01-07 (×4): 750 mg via INTRAVENOUS
  Filled 2014-01-05 (×5): qty 150

## 2014-01-05 MED ORDER — SODIUM CHLORIDE 0.9 % IV SOLN
INTRAVENOUS | Status: DC
  Administered 2014-01-05: 125 mL/h via INTRAVENOUS
  Administered 2014-01-07: 07:00:00 via INTRAVENOUS

## 2014-01-05 MED ORDER — FOLIC ACID 1 MG PO TABS
1.0000 mg | ORAL_TABLET | Freq: Every day | ORAL | Status: DC
Start: 2014-01-05 — End: 2014-01-11
  Administered 2014-01-06 – 2014-01-11 (×6): 1 mg via ORAL
  Filled 2014-01-05 (×7): qty 1

## 2014-01-05 MED ORDER — ALBUTEROL SULFATE HFA 108 (90 BASE) MCG/ACT IN AERS: 2.0000 | INHALATION_SPRAY | Freq: Four times a day (QID) | RESPIRATORY_TRACT | Status: DC | PRN

## 2014-01-05 MED ORDER — HEPARIN SODIUM (PORCINE) 5000 UNIT/ML IJ SOLN
5000.0000 [IU] | Freq: Three times a day (TID) | INTRAMUSCULAR | Status: DC
  Administered 2014-01-05 – 2014-01-11 (×18): 5000 [IU] via SUBCUTANEOUS
  Filled 2014-01-05 (×21): qty 1

## 2014-01-05 MED ORDER — DEXTROSE 5 % IV SOLN
1.0000 g | Freq: Three times a day (TID) | INTRAVENOUS | Status: DC
  Administered 2014-01-06 – 2014-01-09 (×12): 1 g via INTRAVENOUS
  Filled 2014-01-05 (×14): qty 1

## 2014-01-05 MED ORDER — METFORMIN HCL 500 MG PO TABS: 1000.0000 mg | ORAL_TABLET | Freq: Two times a day (BID) | ORAL | Status: DC

## 2014-01-05 MED ORDER — ALBUTEROL SULFATE (2.5 MG/3ML) 0.083% IN NEBU: 2.5000 mg | INHALATION_SOLUTION | Freq: Four times a day (QID) | RESPIRATORY_TRACT | Status: DC | PRN

## 2014-01-05 MED ORDER — DM-GUAIFENESIN ER 30-600 MG PO TB12
1.0000 | ORAL_TABLET | Freq: Two times a day (BID) | ORAL | Status: DC | PRN
  Filled 2014-01-05: qty 1

## 2014-01-05 NOTE — ED Notes (Signed)
PT has a port-a-cath--- right chest. Receiving chemo therapy. Every 4 weeks.   Fever for 2 days, weakness from fever.

## 2014-01-05 NOTE — ED Notes (Addendum)
Informed RN of pt SpO2 levels (approximately 90). RN stated that it was okay to put the pt on 2L of oxygen.

## 2014-01-05 NOTE — Progress Notes (Signed)
ANTIBIOTIC CONSULT NOTE - INITIAL  Pharmacy Consult for vancomycin Indication: pneumonia  Allergies  Allergen Reactions  . Aspirin Other (See Comments)    Blood sugar goes up  . Prednisone Other (See Comments)    Blood sugar goes up    Patient Measurements: weight 68 kg, height 64 inches     Vital Signs: Temp: 98 F (36.7 C) (12/11 2058) Temp Source: Oral (12/11 5885) BP: 112/72 mmHg (12/11 2115) Pulse Rate: 111 (12/11 2115) Intake/Output from previous day:   Intake/Output from this shift:    Labs:  Recent Labs  01/05/14 1541  WBC 8.0  HGB 9.6*  PLT 96*  CREATININE 0.96   Estimated Creatinine Clearance: 59.6 mL/min (by C-G formula based on Cr of 0.96). No results for input(s): VANCOTROUGH, VANCOPEAK, VANCORANDOM, GENTTROUGH, GENTPEAK, GENTRANDOM, TOBRATROUGH, TOBRAPEAK, TOBRARND, AMIKACINPEAK, AMIKACINTROU, AMIKACIN in the last 72 hours.   Microbiology: Recent Results (from the past 720 hour(s))  Blood culture (routine x 2)     Status: None   Collection Time: 12/23/13  6:25 AM  Result Value Ref Range Status   Specimen Description BLOOD LEFT ARM  Final   Special Requests BOTTLES DRAWN AEROBIC ONLY 10CC  Final   Culture  Setup Time   Final    12/23/2013 17:55 Performed at [facility]    Culture   Final    NO GROWTH 5 DAYS Note: Culture results may be compromised due to an excessive volume of blood received in culture bottles. Performed at [facility]    Report Status 12/29/2013 FINAL  Final  Blood culture (routine x 2)     Status: None   Collection Time: 12/23/13  6:30 AM  Result Value Ref Range Status   Specimen Description BLOOD LEFT HAND  Final   Special Requests BOTTLES DRAWN AEROBIC AND ANAEROBIC 10CC  Final   Culture  Setup Time   Final    12/23/2013 17:55 Performed at [facility]    Culture   Final    NO GROWTH 5 DAYS Note: Culture results may be compromised due to an excessive volume of blood received in culture  bottles. Performed at [facility]    Report Status 12/29/2013 FINAL  Final    Medical History: Past Medical History  Diagnosis Date  . Hypertension   . Type II diabetes mellitus   . Chronic bronchitis     "get it q yr" (09/08/2013)  . Pneumonia 2013; 2014; 2015  . Asthma   . Asthmatic bronchitis , chronic   . Anemia   . Stroke "~ 04/2013    "memory problems; weak all over since" (09/08/2013)  . Arthritis     "all my body"  . Memory problem   . Cancer    Assessment: Patient is a 58 y.o F currently receiving chemo for lymphoma.  She presented to the ED with c/o fever and cough.  Chest CT angio showed "Ground-glass airspace opacities in both lungs, most prominent in the upper lobes, likely inflammatory or infectious."   Goal of Therapy:  Vancomycin trough level 15-20 mcg/ml  Plan:  1) vancomyin 750mg  IV q12h   Fumi Guadron P 01/05/2014,9:38 PM

## 2014-01-05 NOTE — ED Provider Notes (Signed)
CSN: [id number]     Arrival date & time 01/05/14  1453 History   First MD Initiated Contact with Patient 01/05/14 1534     Chief Complaint  Patient presents with  . Fever  . Fatigue  . Cancer     (Consider location/radiation/quality/duration/timing/severity/associated sxs/prior Treatment) Patient is a 58 y.o. female presenting with fever. The history is provided by the patient. No language interpreter was used.  Fever Associated symptoms: chills and cough   Associated symptoms: no chest pain, no congestion, no diarrhea, no dysuria, no headaches, no nausea, no sore throat and no vomiting   Bailey Moss is a 58 y/o F with PMHx of HTN, DM type II, chronic bronchitis, asthma, pneumonia, stroke, lymphoma diagnosed in July 2015 on chemo therapy with 2 sessions presenting to the ED with fever and fatigue that has been ongoing for the past 3 days. Daughter, who accompanies patient, reported that patient had a fever of 102.93F the other day and was treated with Tylenol. Daughter reported that the patient has been feeling tired and fatigued easily. Patient reported that she has been having a cough that is dry and stated that she is short of breath, worse with exertion and at rest at times as well. Patient getting treatment and being followed by Dr. Helane Gunther in [place]. Denied nasal congestion, changes eating habits, chest pain, difficulty breathing, syncope, abdominal pain, nausea, vomiting, diarrhea, melena, hematochezia, headache, dizziness, visual changes, changed behavior and activity levels, neck pain, neck stiffness. PCP Dr. Volney [facility] Dr. Helane Gunther  Past Medical History  Diagnosis Date  . Hypertension   . Type II diabetes mellitus   . Chronic bronchitis     "get it q yr" (09/08/2013)  . Pneumonia 2013; 2014; 2015  . Asthma   . Asthmatic bronchitis , chronic   . Anemia   . Stroke "~ 04/2013    "memory problems; weak all over since" (09/08/2013)  . Arthritis     "all my body"  .  Memory problem   . Cancer    Past Surgical History  Procedure Laterality Date  . Tonsillectomy    . Vaginal hysterectomy      "took a tumor out too"  . Eye surgery    . Cataract extraction w/ intraocular lens  implant, bilateral Bilateral   . Cesarean section  X 1  . Tubal ligation     History reviewed. No pertinent family history. History  Substance Use Topics  . Smoking status: Never Smoker   . Smokeless tobacco: Never Used  . Alcohol Use: No   OB History    No data available     Review of Systems  Constitutional: Positive for fever, chills and fatigue.  HENT: Negative for congestion, sore throat and trouble swallowing.   Respiratory: Positive for cough. Negative for chest tightness and shortness of breath.   Cardiovascular: Negative for chest pain.  Gastrointestinal: Negative for nausea, vomiting, abdominal pain, diarrhea and constipation.  Genitourinary: Negative for dysuria and decreased urine volume.  Musculoskeletal: Negative for back pain, neck pain and neck stiffness.  Neurological: Negative for dizziness, weakness, numbness and headaches.      Allergies  Aspirin and Prednisone  Home Medications   Prior to Admission medications   Medication Sig Start Date End Date Taking? Authorizing Provider  acetaminophen (TYLENOL) 325 MG tablet Take 650 mg by mouth every 6 (six) hours as needed for fever.   Yes Historical Provider, MD  albuterol (PROVENTIL HFA;VENTOLIN HFA) 108 (90 BASE) MCG/ACT inhaler Inhale  2 puffs into the lungs every 6 (six) hours as needed for wheezing or shortness of breath. 09/09/13  Yes Shanker Kristeen Mans, MD  albuterol (PROVENTIL) (2.5 MG/3ML) 0.083% nebulizer solution Take 2.5 mg by nebulization every 6 (six) hours as needed for wheezing or shortness of breath.   Yes Historical Provider, MD  Dextromethorphan-Guaifenesin (MUCUS-DM) 30-600 MG TB12 Take 1 tablet by mouth every 12 (twelve) hours as needed (Cough and congestion). 12/25/13  Yes Debbe Odea, MD  Fluticasone-Salmeterol (ADVAIR) 500-50 MCG/DOSE AEPB Inhale 1 puff into the lungs 2 (two) times daily. 09/09/13  Yes Shanker Kristeen Mans, MD  folic acid (FOLVITE) 1 MG tablet Take 1 mg by mouth daily.   Yes Historical Provider, MD  glimepiride (AMARYL) 4 MG tablet Take 4 mg by mouth daily with breakfast.   Yes Historical Provider, MD  Insulin Detemir (LEVEMIR FLEXTOUCH) 100 UNIT/ML Pen Inject 10 Units into the skin every evening.   Yes Historical Provider, MD  metFORMIN (GLUCOPHAGE) 1000 MG tablet Take 1 tablet (1,000 mg total) by mouth 2 (two) times daily with a meal. 09/10/13  Yes Shanker Kristeen Mans, MD  Vitamin D, Ergocalciferol, (DRISDOL) 50000 UNITS CAPS capsule Take 50,000 Units by mouth 3 (three) times a week. On Monday, Wednesday and Friday.   Yes Historical Provider, MD   BP 100/67 mmHg  Pulse 111  Temp(Src) 98 F (36.7 C) (Oral)  Resp 22  SpO2 90% Physical Exam  Constitutional: She is oriented to person, place, and time. She appears well-developed and well-nourished. No distress.  HENT:  Head: Normocephalic and atraumatic.  Eyes: Conjunctivae and EOM are normal. Right eye exhibits no discharge. Left eye exhibits no discharge.  Atypical pupils noted from previous surgeries  Neck: Normal range of motion. Neck supple. No tracheal deviation present.  Negative neck stiffness Negative nuchal rigidity  Negative cervical lymphadenopathy  Negative meningeal signs   Cardiovascular: Regular rhythm and normal heart sounds.  Tachycardia present.  Exam reveals no friction rub.   No murmur heard. Cap refill < 3 seconds  Pulmonary/Chest: Effort normal and breath sounds normal. No respiratory distress. She has no wheezes. She has no rales. She exhibits no tenderness.  Port-a-cath identified to the right side of the chest without erythema, inflammation, lesions  Musculoskeletal: Normal range of motion.  Full ROM to upper and lower extremities without difficulty noted, negative ataxia  noted.  Lymphadenopathy:    She has no cervical adenopathy.  Neurological: She is alert and oriented to person, place, and time. No cranial nerve deficit. She exhibits normal muscle tone. Coordination normal.  Cranial nerves III-XII grossly intact Strength 5+/5+ to upper and lower extremities bilaterally with resistance applied, equal distribution noted Equal grip strength bilaterally  Negative arm drift Fine motor skills intact Negative facial droop Negative slurred speech Negative aphasia Patient follows commands well Patient answers questions appropriately  Skin: Skin is warm and dry. No rash noted. She is not diaphoretic. No erythema.  Psychiatric: She has a normal mood and affect. Her behavior is normal. Thought content normal.  Nursing note and vitals reviewed.   ED Course  Procedures (including critical care time)  Results for orders placed or performed during the hospital encounter of 01/05/14  CBC with Differential  Result Value Ref Range   WBC 8.0 4.0 - 10.5 K/uL   RBC 3.11 (L) 3.87 - 5.11 MIL/uL   Hemoglobin 9.6 (L) 12.0 - 15.0 g/dL   HCT 28.1 (L) 36.0 - 46.0 %   MCV 90.4 78.0 -  100.0 fL   MCH 30.9 26.0 - 34.0 pg   MCHC 34.2 30.0 - 36.0 g/dL   RDW 15.6 (H) 11.5 - 15.5 %   Platelets 96 (L) 150 - 400 K/uL   Neutrophils Relative % 68 43 - 77 %   Neutro Abs 5.4 1.7 - 7.7 K/uL   Lymphocytes Relative 9 (L) 12 - 46 %   Lymphs Abs 0.7 0.7 - 4.0 K/uL   Monocytes Relative 5 3 - 12 %   Monocytes Absolute 0.4 0.1 - 1.0 K/uL   Eosinophils Relative 18 (H) 0 - 5 %   Eosinophils Absolute 1.5 (H) 0.0 - 0.7 K/uL   Basophils Relative 0 0 - 1 %   Basophils Absolute 0.0 0.0 - 0.1 K/uL  Comprehensive metabolic panel  Result Value Ref Range   Sodium 128 (L) 137 - 147 mEq/L   Potassium 4.4 3.7 - 5.3 mEq/L   Chloride 91 (L) 96 - 112 mEq/L   CO2 20 19 - 32 mEq/L   Glucose, Bld 222 (H) 70 - 99 mg/dL   BUN 20 6 - 23 mg/dL   Creatinine, Ser 0.96 0.50 - 1.10 mg/dL   Calcium 8.9 8.4  - 10.5 mg/dL   Total Protein 6.8 6.0 - 8.3 g/dL   Albumin 2.6 (L) 3.5 - 5.2 g/dL   AST 50 (H) 0 - 37 U/L   ALT 41 (H) 0 - 35 U/L   Alkaline Phosphatase 103 39 - 117 U/L   Total Bilirubin 0.5 0.3 - 1.2 mg/dL   GFR calc non Af Amer 63 (L) >90 mL/min   GFR calc Af Amer 73 (L) >90 mL/min   Anion gap 17 (H) 5 - 15  Troponin I  Result Value Ref Range   Troponin I <0.30 <0.30 ng/mL  Urinalysis, Routine w reflex microscopic  Result Value Ref Range   Color, Urine YELLOW YELLOW   APPearance CLEAR CLEAR   Specific Gravity, Urine 1.007 1.005 - 1.030   pH 5.5 5.0 - 8.0   Glucose, UA NEGATIVE NEGATIVE mg/dL   Hgb urine dipstick SMALL (A) NEGATIVE   Bilirubin Urine NEGATIVE NEGATIVE   Ketones, ur NEGATIVE NEGATIVE mg/dL   Protein, ur NEGATIVE NEGATIVE mg/dL   Urobilinogen, UA 0.2 0.0 - 1.0 mg/dL   Nitrite NEGATIVE NEGATIVE   Leukocytes, UA NEGATIVE NEGATIVE  Urine microscopic-add on  Result Value Ref Range   Squamous Epithelial / LPF RARE RARE   WBC, UA 0-2 <3 WBC/hpf   RBC / HPF 0-2 <3 RBC/hpf   Bacteria, UA RARE RARE  I-stat troponin, ED (not at [facility])  Result Value Ref Range   Troponin i, poc 0.05 0.00 - 0.08 ng/mL   Comment 3          I-Stat CG4 Lactic Acid, ED  Result Value Ref Range   Lactic Acid, Venous 1.65 0.5 - 2.2 mmol/L     Results for orders placed or performed during the hospital encounter of 01/05/14  CBC with Differential  Result Value Ref Range   WBC 8.0 4.0 - 10.5 K/uL   RBC 3.11 (L) 3.87 - 5.11 MIL/uL   Hemoglobin 9.6 (L) 12.0 - 15.0 g/dL   HCT 28.1 (L) 36.0 - 46.0 %   MCV 90.4 78.0 - 100.0 fL   MCH 30.9 26.0 - 34.0 pg   MCHC 34.2 30.0 - 36.0 g/dL   RDW 15.6 (H) 11.5 - 15.5 %   Platelets 96 (L) 150 - 400 K/uL   Neutrophils Relative %  68 43 - 77 %   Neutro Abs 5.4 1.7 - 7.7 K/uL   Lymphocytes Relative 9 (L) 12 - 46 %   Lymphs Abs 0.7 0.7 - 4.0 K/uL   Monocytes Relative 5 3 - 12 %   Monocytes Absolute 0.4 0.1 - 1.0 K/uL   Eosinophils Relative 18 (H) 0 - 5  %   Eosinophils Absolute 1.5 (H) 0.0 - 0.7 K/uL   Basophils Relative 0 0 - 1 %   Basophils Absolute 0.0 0.0 - 0.1 K/uL  Comprehensive metabolic panel  Result Value Ref Range   Sodium 128 (L) 137 - 147 mEq/L   Potassium 4.4 3.7 - 5.3 mEq/L   Chloride 91 (L) 96 - 112 mEq/L   CO2 20 19 - 32 mEq/L   Glucose, Bld 222 (H) 70 - 99 mg/dL   BUN 20 6 - 23 mg/dL   Creatinine, Ser 0.96 0.50 - 1.10 mg/dL   Calcium 8.9 8.4 - 10.5 mg/dL   Total Protein 6.8 6.0 - 8.3 g/dL   Albumin 2.6 (L) 3.5 - 5.2 g/dL   AST 50 (H) 0 - 37 U/L   ALT 41 (H) 0 - 35 U/L   Alkaline Phosphatase 103 39 - 117 U/L   Total Bilirubin 0.5 0.3 - 1.2 mg/dL   GFR calc non Af Amer 63 (L) >90 mL/min   GFR calc Af Amer 73 (L) >90 mL/min   Anion gap 17 (H) 5 - 15  Troponin I  Result Value Ref Range   Troponin I <0.30 <0.30 ng/mL  Urinalysis, Routine w reflex microscopic  Result Value Ref Range   Color, Urine YELLOW YELLOW   APPearance CLEAR CLEAR   Specific Gravity, Urine 1.007 1.005 - 1.030   pH 5.5 5.0 - 8.0   Glucose, UA NEGATIVE NEGATIVE mg/dL   Hgb urine dipstick SMALL (A) NEGATIVE   Bilirubin Urine NEGATIVE NEGATIVE   Ketones, ur NEGATIVE NEGATIVE mg/dL   Protein, ur NEGATIVE NEGATIVE mg/dL   Urobilinogen, UA 0.2 0.0 - 1.0 mg/dL   Nitrite NEGATIVE NEGATIVE   Leukocytes, UA NEGATIVE NEGATIVE  Urine microscopic-add on  Result Value Ref Range   Squamous Epithelial / LPF RARE RARE   WBC, UA 0-2 <3 WBC/hpf   RBC / HPF 0-2 <3 RBC/hpf   Bacteria, UA RARE RARE  I-stat troponin, ED (not at [facility])  Result Value Ref Range   Troponin i, poc 0.05 0.00 - 0.08 ng/mL   Comment 3          I-Stat CG4 Lactic Acid, ED  Result Value Ref Range   Lactic Acid, Venous 1.65 0.5 - 2.2 mmol/L    Labs Review Labs Reviewed  CBC WITH DIFFERENTIAL - Abnormal; Notable for the following:    RBC 3.11 (*)    Hemoglobin 9.6 (*)    HCT 28.1 (*)    RDW 15.6 (*)    Platelets 96 (*)    Lymphocytes Relative 9 (*)    Eosinophils Relative 18  (*)    Eosinophils Absolute 1.5 (*)    All other components within normal limits  COMPREHENSIVE METABOLIC PANEL - Abnormal; Notable for the following:    Sodium 128 (*)    Chloride 91 (*)    Glucose, Bld 222 (*)    Albumin 2.6 (*)    AST 50 (*)    ALT 41 (*)    GFR calc non Af Amer 63 (*)    GFR calc Af Amer 73 (*)  Anion gap 17 (*)    All other components within normal limits  URINALYSIS, ROUTINE W REFLEX MICROSCOPIC - Abnormal; Notable for the following:    Hgb urine dipstick SMALL (*)    All other components within normal limits  CULTURE, BLOOD (ROUTINE X 2)  CULTURE, BLOOD (ROUTINE X 2)  URINE CULTURE  CULTURE, EXPECTORATED SPUTUM-ASSESSMENT  GRAM STAIN  TROPONIN I  URINE MICROSCOPIC-ADD ON  HIV ANTIBODY (ROUTINE TESTING)  LEGIONELLA ANTIGEN, URINE  STREP PNEUMONIAE URINARY ANTIGEN  I-STAT TROPOININ, ED  I-STAT CG4 LACTIC ACID, ED    Imaging Review Ct Angio Chest Pe W/cm &/or Wo Cm  01/05/2014   CLINICAL DATA:  Two day history of shortness of breath.  EXAM: CT ANGIOGRAPHY CHEST WITH CONTRAST  TECHNIQUE: Multidetector CT imaging of the chest was performed using the standard protocol during bolus administration of intravenous contrast. Multiplanar CT image reconstructions and MIPs were obtained to evaluate the vascular anatomy.  CONTRAST:  179mL OMNIPAQUE IOHEXOL 350 MG/ML IV.  COMPARISON:  CTA chest 12/23/2013, 09/08/2013.  FINDINGS: As on the examination 2 weeks ago, no filling defects within either main pulmonary artery or their branches in either lung. Heart size remains normal. No pericardial effusion. No visible coronary atherosclerosis. No visible atherosclerosis involving the thoracic aorta.  Scattered patchy ground-glass airspace opacities throughout both lungs, most prominent in the upper lobes, new since the prior examination. No pulmonary parenchymal nodules or masses. No pleural effusions. Linear scarring medially in the deep right lower lobe.  No significant  mediastinal or hilar lymphadenopathy. 2.6 x 1.4 cm and 2.6 x 1.5 cm respectively, unchanged since the examination 2 weeks ago, significantly improved since the examination in August. No evidence of right axillary lymphadenopathy.  Approximate 3.5 cm accessory splenule in the left upper quadrant of the visualized abdomen as noted previously. Visualized upper abdomen unremarkable for the early arterial phase of enhancement. Bone window images unremarkable.  Review of the MIP images confirms the above findings.  IMPRESSION: 1. No evidence of pulmonary embolism. 2. Ground-glass airspace opacities in both lungs, most prominent in the upper lobes, likely inflammatory or infectious. 3. Stable left axillary lymphadenopathy since the examination 2 weeks ago, much improved since August. No lymphadenopathy elsewhere.   Electronically Signed   By: Evangeline Dakin M.D.   On: 01/05/2014 20:08   Dg Chest Port 1 View  01/05/2014   CLINICAL DATA:  58 year old female with a two-day history of fever  EXAM: PORTABLE CHEST - 1 VIEW  COMPARISON:  Prior chest x-ray 12/15/2013  FINDINGS: Right IJ approach single-lumen power injectable port catheter. Catheter tip in good position in the upper right atrium. Cardiac and mediastinal contours are within normal limits. Minimal bibasilar atelectasis unchanged compared to recent prior. No new focal airspace consolidation, pulmonary edema, pleural effusion or pneumothorax. No acute osseous abnormality.  IMPRESSION: 1. Low inspiratory volumes with minimal bibasilar atelectasis. 2. No acute cardiopulmonary process. 3. Right IJ approach single-lumen power injectable port catheter with the tip in good position overlying the upper right atrium.   Electronically Signed   By: Jacqulynn Cadet M.D.   On: 01/05/2014 16:09     EKG Interpretation   Date/Time:  Friday January 05 2014 15:20:30 EST Ventricular Rate:  115 PR Interval:  118 QRS Duration: 70 QT Interval:  348 QTC Calculation:  481 R Axis:   75 Text Interpretation:  Sinus tachycardia Otherwise normal ECG since last  tracing no significant change Confirmed by BELFI  MD, MELANIE ([id number]) on  01/05/2014  3:38:12 PM       9:05 PM This provider spoke with Dr Alcario Drought, [facility]. Discussed case, labs, imaging, vitals, ED course in great detail. Patient to be admitted to telemetry for HCAP.   MDM   Final diagnoses:  Fever  HCAP (healthcare-associated pneumonia)  Lymphoma  Immunocompromised    Medications  Insulin Detemir (LEVEMIR) FlexPen 10 Units (not administered)  glimepiride (AMARYL) tablet 4 mg (not administered)  folic acid (FOLVITE) tablet 1 mg (not administered)  mometasone-formoterol (DULERA) 200-5 MCG/ACT inhaler 2 puff (not administered)  MUCUS-DM 30-600 MG TB12 1 tablet (not administered)  albuterol (PROVENTIL) (2.5 MG/3ML) 0.083% nebulizer solution 2.5 mg (not administered)  acetaminophen (TYLENOL) tablet 650 mg (not administered)  albuterol (PROVENTIL HFA;VENTOLIN HFA) 108 (90 BASE) MCG/ACT inhaler 2 puff (not administered)  heparin injection 5,000 Units (not administered)  0.9 %  sodium chloride infusion (not administered)  ceFEPIme (MAXIPIME) 1 g in dextrose 5 % 50 mL IVPB (not administered)  insulin aspart (novoLOG) injection 0-15 Units (not administered)  sodium chloride 0.9 % bolus 1,000 mL (0 mLs Intravenous Stopped 01/05/14 1838)  sodium chloride 0.9 % bolus 1,000 mL (1,000 mLs Intravenous New Bag/Given 01/05/14 1920)  iohexol (OMNIPAQUE) 350 MG/ML injection 100 mL (100 mLs Intravenous Contrast Given 01/05/14 1942)   Filed Vitals:   01/05/14 2000 01/05/14 2001 01/05/14 2030 01/05/14 2058  BP: 103/42 103/42 118/56 100/67  Pulse: 97 114  111  Temp: 98.1 F (36.7 C)   98 F (36.7 C)  TempSrc: Oral   Oral  Resp: 18 20 20 22   SpO2: 93% 92%  90%   EKG noted sinus tachycardia with a heart rate of 115 bpm-no significant change since last tracing. I-STAT troponin negative elevation.  Troponin negative elevation. CBC noted elevated eosinophils 18. Hemoglobin 9.6, hematocrit 28.1. When compared to previous labs patient's hemoglobin has been as low as 9.0-patient appears to be chronically anemic. CMP noted hyponatremia of 128, low chloride of 91. AST mildly elevated at 50 and ALC mildly elevated at 41. Glucose 222, anion gap 17.0 mEq per liter.. Lactic acid negative elevation. Blood culture 2 pending. Urinalysis negative for nitrites, leukocytes-negative findings of infection identified. Chest x-ray noted low inspiratory volumes with minimal bibasilar linear atelectasis-no acute cardiopulmonary process identified. Right IJ approach single-lumen power injectable port catheter in proper position noted on chest x-ray. CT angiogram of chest negative for pulmonary embolism. Groundglass airspace opacities in both lungs most prominent within upper lobes likely inflammatory infectious. Stable left axillary lymphadenopathy since examination 2 weeks ago, much improvement since August 2015-no lymphadenopathy elsewhere. Patient start on IV fluids with 2 peripheral lines placed. Rectal temperature 99.30F. Patient to be started on broad-spectrum antibiotics for unknown source of infection - patient to be started on Vancomycin and Cefepime.  Patient presenting to the ED with fever and fatigue-while in the ED setting patient has been afebrile. Continues to be tachycardic with a heart rate of 111 bpm. Fluids administered in ED setting. Negative findings of PE. Suspicion possibly HCAP secondary to findings on CT angiogram of chest. Patient to be admitted secondary to chemotherapy and being immunocompromised. Patient to be admitted under the care of [facility]. Discussed plan for admission with patient who agrees to plan of care. Patient stable for transfer.  Jamse Mead, PA-C 01/05/14 2111  Jamse Mead, PA-C 01/05/14 2113  Malvin Johns, MD 01/05/14 [id number]

## 2014-01-05 NOTE — ED Notes (Signed)
Pt reports currently getting chemo for lymphoma, having fever and fatigue x 2 days with nonproductive cough. Denies n/v/d or urinary symptoms. Mask on pt at triage, airway intact.

## 2014-01-05 NOTE — H&P (Signed)
[facility] History and Physical  Rise Traeger RXV:[id number] DOB: Jul 17, 1955 DOA: 01/05/2014  Referring physician: EDP PCP: Honor Junes, MD   Chief Complaint: Fever, cough   HPI: Bailey Moss is a 58 y.o. female with B cell lymphoma, undergoing chemo with next session scheduled for Monday.  Recent admission to [facility] hospital just discharged 11 days ago for Asthma / COPD exacerbation.  Patient presents to [facility] with 3 day history of fever, worsening cough, fatigue.  Fever is up to 102.1 at home.  Treated with tylenol.  Cough is dry and patient is SOB.  Review of Systems: Systems reviewed.  As above, otherwise negative  Past Medical History  Diagnosis Date  . Hypertension   . Type II diabetes mellitus   . Chronic bronchitis     "get it q yr" (09/08/2013)  . Pneumonia 2013; 2014; 2015  . Asthma   . Asthmatic bronchitis , chronic   . Anemia   . Stroke "~ 04/2013    "memory problems; weak all over since" (09/08/2013)  . Arthritis     "all my body"  . Memory problem   . Cancer    Past Surgical History  Procedure Laterality Date  . Tonsillectomy    . Vaginal hysterectomy      "took a tumor out too"  . Eye surgery    . Cataract extraction w/ intraocular lens  implant, bilateral Bilateral   . Cesarean section  X 1  . Tubal ligation     Social History:  reports that she has never smoked. She has never used smokeless tobacco. She reports that she does not drink alcohol or use illicit drugs.  Allergies  Allergen Reactions  . Aspirin Other (See Comments)    Blood sugar goes up  . Prednisone Other (See Comments)    Blood sugar goes up    History reviewed. No pertinent family history.   Prior to Admission medications   Medication Sig Start Date End Date Taking? Authorizing Provider  acetaminophen (TYLENOL) 325 MG tablet Take 650 mg by mouth every 6 (six) hours as needed for fever.   Yes Historical Provider, MD  albuterol (PROVENTIL HFA;VENTOLIN HFA) 108 (90 BASE)  MCG/ACT inhaler Inhale 2 puffs into the lungs every 6 (six) hours as needed for wheezing or shortness of breath. 09/09/13  Yes Shanker Kristeen Mans, MD  albuterol (PROVENTIL) (2.5 MG/3ML) 0.083% nebulizer solution Take 2.5 mg by nebulization every 6 (six) hours as needed for wheezing or shortness of breath.   Yes Historical Provider, MD  Dextromethorphan-Guaifenesin (MUCUS-DM) 30-600 MG TB12 Take 1 tablet by mouth every 12 (twelve) hours as needed (Cough and congestion). 12/25/13  Yes Debbe Odea, MD  Fluticasone-Salmeterol (ADVAIR) 500-50 MCG/DOSE AEPB Inhale 1 puff into the lungs 2 (two) times daily. 09/09/13  Yes Shanker Kristeen Mans, MD  folic acid (FOLVITE) 1 MG tablet Take 1 mg by mouth daily.   Yes Historical Provider, MD  glimepiride (AMARYL) 4 MG tablet Take 4 mg by mouth daily with breakfast.   Yes Historical Provider, MD  Insulin Detemir (LEVEMIR FLEXTOUCH) 100 UNIT/ML Pen Inject 10 Units into the skin every evening.   Yes Historical Provider, MD  metFORMIN (GLUCOPHAGE) 1000 MG tablet Take 1 tablet (1,000 mg total) by mouth 2 (two) times daily with a meal. 09/10/13  Yes Shanker Kristeen Mans, MD  Vitamin D, Ergocalciferol, (DRISDOL) 50000 UNITS CAPS capsule Take 50,000 Units by mouth 3 (three) times a week. On Monday, Wednesday and Friday.   Yes Historical  Provider, MD   Physical Exam: Filed Vitals:   01/05/14 2058  BP: 100/67  Pulse: 111  Temp: 98 F (36.7 C)  Resp: 22    BP 100/67 mmHg  Pulse 111  Temp(Src) 98 F (36.7 C) (Oral)  Resp 22  SpO2 90%  General Appearance:    Alert, oriented, no distress, appears stated age  Head:    Normocephalic, atraumatic  Eyes:    PERRL, EOMI, sclera non-icteric        Nose:   Nares without drainage or epistaxis. Mucosa, turbinates normal  Throat:   Moist mucous membranes. Oropharynx without erythema or exudate.  Neck:   Supple. No carotid bruits.  No thyromegaly.  No lymphadenopathy.   Back:     No CVA tenderness, no spinal tenderness  Lungs:      Clear to auscultation bilaterally, without wheezes, rhonchi or rales  Chest wall:    No tenderness to palpitation  Heart:    Regular rate and rhythm without murmurs, gallops, rubs  Abdomen:     Soft, non-tender, nondistended, normal bowel sounds, no organomegaly  Genitalia:    deferred  Rectal:    deferred  Extremities:   No clubbing, cyanosis or edema.  Pulses:   2+ and symmetric all extremities  Skin:   Skin color, texture, turgor normal, no rashes or lesions  Lymph nodes:   Cervical, supraclavicular, and axillary nodes normal  Neurologic:   CNII-XII intact. Normal strength, sensation and reflexes      throughout    Labs on Admission:  Basic Metabolic Panel:  Recent Labs Lab 01/05/14 1541  NA 128*  K 4.4  CL 91*  CO2 20  GLUCOSE 222*  BUN 20  CREATININE 0.96  CALCIUM 8.9   Liver Function Tests:  Recent Labs Lab 01/05/14 1541  AST 50*  ALT 41*  ALKPHOS 103  BILITOT 0.5  PROT 6.8  ALBUMIN 2.6*   No results for input(s): LIPASE, AMYLASE in the last 168 hours. No results for input(s): AMMONIA in the last 168 hours. CBC:  Recent Labs Lab 01/05/14 1541  WBC 8.0  NEUTROABS 5.4  HGB 9.6*  HCT 28.1*  MCV 90.4  PLT 96*   Cardiac Enzymes:  Recent Labs Lab 01/05/14 1541  TROPONINI <0.30    BNP (last 3 results)  Recent Labs  12/22/13 2321  PROBNP 76.3   CBG: No results for input(s): GLUCAP in the last 168 hours.  Radiological Exams on Admission: Ct Angio Chest Pe W/cm &/or Wo Cm  01/05/2014   CLINICAL DATA:  Two day history of shortness of breath.  EXAM: CT ANGIOGRAPHY CHEST WITH CONTRAST  TECHNIQUE: Multidetector CT imaging of the chest was performed using the standard protocol during bolus administration of intravenous contrast. Multiplanar CT image reconstructions and MIPs were obtained to evaluate the vascular anatomy.  CONTRAST:  132mL OMNIPAQUE IOHEXOL 350 MG/ML IV.  COMPARISON:  CTA chest 12/23/2013, 09/08/2013.  FINDINGS: As on the examination  2 weeks ago, no filling defects within either main pulmonary artery or their branches in either lung. Heart size remains normal. No pericardial effusion. No visible coronary atherosclerosis. No visible atherosclerosis involving the thoracic aorta.  Scattered patchy ground-glass airspace opacities throughout both lungs, most prominent in the upper lobes, new since the prior examination. No pulmonary parenchymal nodules or masses. No pleural effusions. Linear scarring medially in the deep right lower lobe.  No significant mediastinal or hilar lymphadenopathy. 2.6 x 1.4 cm and 2.6 x 1.5 cm respectively, unchanged since  the examination 2 weeks ago, significantly improved since the examination in August. No evidence of right axillary lymphadenopathy.  Approximate 3.5 cm accessory splenule in the left upper quadrant of the visualized abdomen as noted previously. Visualized upper abdomen unremarkable for the early arterial phase of enhancement. Bone window images unremarkable.  Review of the MIP images confirms the above findings.  IMPRESSION: 1. No evidence of pulmonary embolism. 2. Ground-glass airspace opacities in both lungs, most prominent in the upper lobes, likely inflammatory or infectious. 3. Stable left axillary lymphadenopathy since the examination 2 weeks ago, much improved since August. No lymphadenopathy elsewhere.   Electronically Signed   By: Evangeline Dakin M.D.   On: 01/05/2014 20:08   Dg Chest Port 1 View  01/05/2014   CLINICAL DATA:  58 year old female with a two-day history of fever  EXAM: PORTABLE CHEST - 1 VIEW  COMPARISON:  Prior chest x-ray 12/15/2013  FINDINGS: Right IJ approach single-lumen power injectable port catheter. Catheter tip in good position in the upper right atrium. Cardiac and mediastinal contours are within normal limits. Minimal bibasilar atelectasis unchanged compared to recent prior. No new focal airspace consolidation, pulmonary edema, pleural effusion or pneumothorax. No  acute osseous abnormality.  IMPRESSION: 1. Low inspiratory volumes with minimal bibasilar atelectasis. 2. No acute cardiopulmonary process. 3. Right IJ approach single-lumen power injectable port catheter with the tip in good position overlying the upper right atrium.   Electronically Signed   By: Jacqulynn Cadet M.D.   On: 01/05/2014 16:09    EKG: Independently reviewed.  Assessment/Plan Principal Problem:   HCAP (healthcare-associated pneumonia) Active Problems:   DM (diabetes mellitus)   Sepsis due to pneumonia   Lymphoma   1. HCAP causing sepsis - 1. PNA pathway 2. Cultures pending 3. Cefepime and vanc 4. Developed this PNA despite recent course of levaquin 5. Tylenol for fever 6. Tele monitor for tachycardia 7. IVF 2. DM2 - 1. Hold metformin since she just received contrast, also holding amaryl 2. Continue levemir and adding med dose SSI AC/HS 3. B cell lymphoma 1. Next chemo dose was supposed to be Monday 2. Likely will be delayed due to HCAP 3. Will need to resume chemo ASAP following hospital stay.  Oncologist is up at [facility].    Code Status: Full Code  Family Communication: Daughter at bedside Disposition Plan: Admit to inpatient   Time spent: 68 min  Dwight Burdo M. [facility] Pager [phone]  If 7AM-7PM, please contact the day team taking care of the patient [email or web address] Password [email or web address] 01/05/2014, 9:22 PM

## 2014-01-06 DIAGNOSIS — E0829 Diabetes mellitus due to underlying condition with other diabetic kidney complication: Secondary | ICD-10-CM

## 2014-01-06 DIAGNOSIS — J189 Pneumonia, unspecified organism: Secondary | ICD-10-CM

## 2014-01-06 DIAGNOSIS — A419 Sepsis, unspecified organism: Secondary | ICD-10-CM

## 2014-01-06 DIAGNOSIS — C8590 Non-Hodgkin lymphoma, unspecified, unspecified site: Secondary | ICD-10-CM

## 2014-01-06 LAB — INFLUENZA PANEL BY PCR (TYPE A & B)
H1N1 flu by pcr: NOT DETECTED
INFLAPCR: NEGATIVE
Influenza B By PCR: NEGATIVE

## 2014-01-06 LAB — URINE CULTURE
COLONY COUNT: NO GROWTH
Culture: NO GROWTH

## 2014-01-06 LAB — GLUCOSE, CAPILLARY
GLUCOSE-CAPILLARY: 136 mg/dL — AB (ref 70–99)
GLUCOSE-CAPILLARY: 46 mg/dL — AB (ref 70–99)
GLUCOSE-CAPILLARY: 52 mg/dL — AB (ref 70–99)
Glucose-Capillary: 100 mg/dL — ABNORMAL HIGH (ref 70–99)
Glucose-Capillary: 155 mg/dL — ABNORMAL HIGH (ref 70–99)
Glucose-Capillary: 65 mg/dL — ABNORMAL LOW (ref 70–99)
Glucose-Capillary: 83 mg/dL (ref 70–99)
Glucose-Capillary: 92 mg/dL (ref 70–99)

## 2014-01-06 LAB — HIV ANTIBODY (ROUTINE TESTING W REFLEX): HIV 1&2 Ab, 4th Generation: REACTIVE — AB

## 2014-01-06 LAB — HIV 1/2 CONFIRMATION
HIV-1 antibody: POSITIVE — AB
HIV-2 Ab: NEGATIVE

## 2014-01-06 LAB — STREP PNEUMONIAE URINARY ANTIGEN: Strep Pneumo Urinary Antigen: NEGATIVE

## 2014-01-06 MED ORDER — BOOST PLUS PO LIQD
237.0000 mL | ORAL | Status: DC
  Administered 2014-01-06 – 2014-01-10 (×5): 237 mL via ORAL
  Filled 2014-01-06 (×8): qty 237

## 2014-01-06 MED ORDER — IPRATROPIUM-ALBUTEROL 0.5-2.5 (3) MG/3ML IN SOLN
3.0000 mL | RESPIRATORY_TRACT | Status: DC
Start: 2014-01-06 — End: 2014-01-06
  Administered 2014-01-06: 3 mL via RESPIRATORY_TRACT
  Filled 2014-01-06: qty 3

## 2014-01-06 MED ORDER — DEXTROSE 50 % IV SOLN
25.0000 mL | Freq: Once | INTRAVENOUS | Status: AC
  Administered 2014-01-06: 25 mL via INTRAVENOUS

## 2014-01-06 MED ORDER — SODIUM CHLORIDE 0.9 % IJ SOLN
10.0000 mL | INTRAMUSCULAR | Status: DC | PRN
  Administered 2014-01-06: 10 mL
  Administered 2014-01-08: 20 mL
  Administered 2014-01-09: 10 mL
  Filled 2014-01-06 (×3): qty 40

## 2014-01-06 MED ORDER — IPRATROPIUM-ALBUTEROL 0.5-2.5 (3) MG/3ML IN SOLN
3.0000 mL | Freq: Three times a day (TID) | RESPIRATORY_TRACT | Status: DC
  Administered 2014-01-06 – 2014-01-07 (×2): 3 mL via RESPIRATORY_TRACT
  Filled 2014-01-06 (×2): qty 3

## 2014-01-06 MED ORDER — IBUPROFEN 600 MG PO TABS
600.0000 mg | ORAL_TABLET | Freq: Once | ORAL | Status: AC
  Administered 2014-01-06: 600 mg via ORAL
  Filled 2014-01-06: qty 1

## 2014-01-06 MED ORDER — DEXTROSE 50 % IV SOLN
INTRAVENOUS | Status: AC
  Filled 2014-01-06: qty 50

## 2014-01-06 MED ORDER — CETYLPYRIDINIUM CHLORIDE 0.05 % MT LIQD
7.0000 mL | Freq: Two times a day (BID) | OROMUCOSAL | Status: DC
  Administered 2014-01-06 – 2014-01-11 (×11): 7 mL via OROMUCOSAL

## 2014-01-06 MED ORDER — GUAIFENESIN ER 600 MG PO TB12
1200.0000 mg | ORAL_TABLET | Freq: Two times a day (BID) | ORAL | Status: DC
  Administered 2014-01-06 – 2014-01-11 (×11): 1200 mg via ORAL
  Filled 2014-01-06 (×12): qty 2

## 2014-01-06 NOTE — Progress Notes (Signed)
[facility] PROGRESS NOTE   Bailey Moss [id number]:[id number] DOB: [birth year] DOA: 01/05/2014 PCP: Honor Junes, MD  HPI/Subjective: Seen with daughter at bedside, feels very weak. Continues to have cough, shortness of breath and sputum production.  Assessment/Plan: Principal Problem:   HCAP (healthcare-associated pneumonia) Active Problems:   DM (diabetes mellitus)   Sepsis due to pneumonia   Lymphoma    Healthcare associated pneumonia Patient was recently in the hospital for COPD exacerbation discharge on levofloxacin. Comes back to the hospital with high fever and pneumonia symptoms, CT scan showed pneumonia. Treated as HCAP with cefepime and vancomycin. Supportive management with bronchodilators, mucolytics, antitussives and oxygen as needed.  Sepsis secondary to pneumonia Temp of 101.7 and pulse of 125 in the presence of pneumonia. Blood, urine and sputum cultures obtained. Patient already on broad-spectrum antibiotics, hydrate with IV fluids and follow clinically.  Diabetes mellitus Has uncontrolled diabetes mellitus, will check hemoglobin A1c. Adjust her insulin regimen, carbohydrate modified diet and SSI while she is in the hospital.  Lymphoma Patient has active B-cell lymphoma and she is getting treatment for that. Next chemotherapy supposed to be on Monday 12/14 in [place], [place].  Code Status: Full code Family Communication: Plan discussed with the patient. Disposition Plan: Remains inpatient   Consultants:  None  Procedures:  None  Antibiotics:  Vancomycin and cefepime   Objective: Filed Vitals:   01/06/14 0955  BP:   Pulse:   Temp: 99.4 F (37.4 C)  Resp:     Intake/Output Summary (Last 24 hours) at 01/06/14 1113 Last data filed at 01/06/14 1023  Gross per 24 hour  Intake 1502.5 ml  Output      0 ml  Net 1502.5 ml   Filed Weights   01/05/14 2228  Weight: 66.9 kg (147 lb 7.8 oz)    Exam: General: Alert and awake,  oriented x3, not in any acute distress. HEENT: anicteric sclera, pupils reactive to light and accommodation, EOMI CVS: S1-S2 clear, no murmur rubs or gallops Chest: Scattered rhonchi Abdomen: soft nontender, nondistended, normal bowel sounds, no organomegaly Extremities: no cyanosis, clubbing or edema noted bilaterally Neuro: Cranial nerves II-XII intact, no focal neurological deficits  Data Reviewed: Basic Metabolic Panel:  Recent Labs Lab 01/05/14 1541  NA 128*  K 4.4  CL 91*  CO2 20  GLUCOSE 222*  BUN 20  CREATININE 0.96  CALCIUM 8.9   Liver Function Tests:  Recent Labs Lab 01/05/14 1541  AST 50*  ALT 41*  ALKPHOS 103  BILITOT 0.5  PROT 6.8  ALBUMIN 2.6*   No results for input(s): LIPASE, AMYLASE in the last 168 hours. No results for input(s): AMMONIA in the last 168 hours. CBC:  Recent Labs Lab 01/05/14 1541  WBC 8.0  NEUTROABS 5.4  HGB 9.6*  HCT 28.1*  MCV 90.4  PLT 96*   Cardiac Enzymes:  Recent Labs Lab 01/05/14 1541  TROPONINI <0.30   BNP (last 3 results)  Recent Labs  12/22/13 2321  PROBNP 76.3   CBG:  Recent Labs Lab 01/05/14 2329 01/06/14 0005 01/06/14 0042 01/06/14 0811 01/06/14 0855  GLUCAP 52* 46* 155* 65* 92    Micro Recent Results (from the past 240 hour(s))  Blood Culture (routine x 2)     Status: None (Preliminary result)   Collection Time: 01/05/14  4:00 PM  Result Value Ref Range Status   Specimen Description BLOOD PORTA CATH  Final   Special Requests BOTTLES DRAWN AEROBIC AND ANAEROBIC 5CC  Final   Culture  Setup Time   Final    01/05/2014 22:41 Performed at [facility]    Culture   Final           BLOOD CULTURE RECEIVED NO GROWTH TO DATE CULTURE WILL BE HELD FOR 5 DAYS BEFORE ISSUING A FINAL NEGATIVE REPORT Performed at [facility]    Report Status PENDING  Incomplete  Blood Culture (routine x 2)     Status: None (Preliminary result)   Collection Time: 01/05/14  4:12 PM  Result Value  Ref Range Status   Specimen Description BLOOD ARM RIGHT  Final   Special Requests BOTTLES DRAWN AEROBIC AND ANAEROBIC 5CC  Final   Culture  Setup Time   Final    01/05/2014 22:42 Performed at [facility]    Culture   Final           BLOOD CULTURE RECEIVED NO GROWTH TO DATE CULTURE WILL BE HELD FOR 5 DAYS BEFORE ISSUING A FINAL NEGATIVE REPORT Performed at [facility]    Report Status PENDING  Incomplete     Studies: Ct Angio Chest Pe W/cm &/or Wo Cm  01/05/2014   CLINICAL DATA:  Two day history of shortness of breath.  EXAM: CT ANGIOGRAPHY CHEST WITH CONTRAST  TECHNIQUE: Multidetector CT imaging of the chest was performed using the standard protocol during bolus administration of intravenous contrast. Multiplanar CT image reconstructions and MIPs were obtained to evaluate the vascular anatomy.  CONTRAST:  17mL OMNIPAQUE IOHEXOL 350 MG/ML IV.  COMPARISON:  CTA chest 12/23/2013, 09/08/2013.  FINDINGS: As on the examination 2 weeks ago, no filling defects within either main pulmonary artery or their branches in either lung. Heart size remains normal. No pericardial effusion. No visible coronary atherosclerosis. No visible atherosclerosis involving the thoracic aorta.  Scattered patchy ground-glass airspace opacities throughout both lungs, most prominent in the upper lobes, new since the prior examination. No pulmonary parenchymal nodules or masses. No pleural effusions. Linear scarring medially in the deep right lower lobe.  No significant mediastinal or hilar lymphadenopathy. 2.6 x 1.4 cm and 2.6 x 1.5 cm respectively, unchanged since the examination 2 weeks ago, significantly improved since the examination in August. No evidence of right axillary lymphadenopathy.  Approximate 3.5 cm accessory splenule in the left upper quadrant of the visualized abdomen as noted previously. Visualized upper abdomen unremarkable for the early arterial phase of enhancement. Bone window images  unremarkable.  Review of the MIP images confirms the above findings.  IMPRESSION: 1. No evidence of pulmonary embolism. 2. Ground-glass airspace opacities in both lungs, most prominent in the upper lobes, likely inflammatory or infectious. 3. Stable left axillary lymphadenopathy since the examination 2 weeks ago, much improved since August. No lymphadenopathy elsewhere.   Electronically Signed   By: Evangeline Dakin M.D.   On: 01/05/2014 20:08   Dg Chest Port 1 View  01/05/2014   CLINICAL DATA:  58 year old female with a two-day history of fever  EXAM: PORTABLE CHEST - 1 VIEW  COMPARISON:  Prior chest x-ray 12/15/2013  FINDINGS: Right IJ approach single-lumen power injectable port catheter. Catheter tip in good position in the upper right atrium. Cardiac and mediastinal contours are within normal limits. Minimal bibasilar atelectasis unchanged compared to recent prior. No new focal airspace consolidation, pulmonary edema, pleural effusion or pneumothorax. No acute osseous abnormality.  IMPRESSION: 1. Low inspiratory volumes with minimal bibasilar atelectasis. 2. No acute cardiopulmonary process. 3. Right IJ approach single-lumen power injectable port catheter with the tip  in good position overlying the upper right atrium.   Electronically Signed   By: Jacqulynn Cadet M.D.   On: 01/05/2014 16:09    Scheduled Meds: . antiseptic oral rinse  7 mL Mouth Rinse q12n4p  . ceFEPime (MAXIPIME) IV  1 g Intravenous 3 times per day  . folic acid  1 mg Oral Daily  . heparin  5,000 Units Subcutaneous 3 times per day  . insulin aspart  0-15 Units Subcutaneous TID WC  . insulin detemir  10 Units Subcutaneous QPM  . mometasone-formoterol  2 puff Inhalation BID  . vancomycin  750 mg Intravenous Q12H   Continuous Infusions: . sodium chloride 125 mL/hr (01/05/14 2254)       Time spent: 35 minutes    [facility] Pager [phone] If 7PM-7AM, please contact night-coverage at  [email or web address], password [facility] 01/06/2014, 11:13 AM  LOS: 1 day

## 2014-01-06 NOTE — Progress Notes (Signed)
Hypoglycemic Event  CBG: 65  Treatment: 15 GM carbohydrate snack  Symptoms: None  Follow-up CBG: Time: 1594 CBG Result:92  Possible Reasons for Event: Inadequate meal intake  Comments/MD notified:none    Bailey Moss S  Remember to initiate Hypoglycemia Order Set & complete

## 2014-01-06 NOTE — Progress Notes (Signed)
Nutrition Brief Note  Patient identified on the Malnutrition Screening Tool (MST) Report  Wt Readings from Last 15 Encounters:  01/05/14 147 lb 7.8 oz (66.9 kg)  12/23/13 153 lb 1.6 oz (69.446 kg)  09/08/13 153 lb 3.2 oz (69.491 kg)    Body mass index is 25.3 kg/(m^2). Patient meets criteria for Overweight based on current BMI.   Current diet order is Carb Mod, patient is consuming approximately >75% of meals at this time. Labs and medications reviewed.   Pt reported small amount of weight loss and taste changes d/t chemo; however this has recently improved and pt's weight has stabilized. Is eating well at home, at least two meals daily and supplements with one Boost daily. Will order Q24H. Pt denied any current taste alterations, nausea or abd pain post meals  No nutrition interventions warranted at this time. If nutrition issues arise, please consult RD.   Atlee Abide MS RD LDN Clinical Dietitian [phone]

## 2014-01-07 DIAGNOSIS — M069 Rheumatoid arthritis, unspecified: Secondary | ICD-10-CM

## 2014-01-07 DIAGNOSIS — E119 Type 2 diabetes mellitus without complications: Secondary | ICD-10-CM

## 2014-01-07 DIAGNOSIS — B20 Human immunodeficiency virus [HIV] disease: Secondary | ICD-10-CM | POA: Diagnosis present

## 2014-01-07 LAB — CBC
HCT: 26 % — ABNORMAL LOW (ref 36.0–46.0)
HEMOGLOBIN: 8.5 g/dL — AB (ref 12.0–15.0)
MCH: 29.4 pg (ref 26.0–34.0)
MCHC: 32.7 g/dL (ref 30.0–36.0)
MCV: 90 fL (ref 78.0–100.0)
Platelets: 103 10*3/uL — ABNORMAL LOW (ref 150–400)
RBC: 2.89 MIL/uL — AB (ref 3.87–5.11)
RDW: 15.9 % — ABNORMAL HIGH (ref 11.5–15.5)
WBC: 7.6 10*3/uL (ref 4.0–10.5)

## 2014-01-07 LAB — BASIC METABOLIC PANEL
Anion gap: 14 (ref 5–15)
BUN: 19 mg/dL (ref 6–23)
CO2: 21 mEq/L (ref 19–32)
Calcium: 8.3 mg/dL — ABNORMAL LOW (ref 8.4–10.5)
Chloride: 100 mEq/L (ref 96–112)
Creatinine, Ser: 0.65 mg/dL (ref 0.50–1.10)
GFR calc Af Amer: >90 mL/min (ref >90)
GFR calc non Af Amer: >90 mL/min (ref >90)
GLUCOSE: 79 mg/dL (ref 70–99)
POTASSIUM: 3.5 meq/L — AB (ref 3.7–5.3)
Sodium: 135 mEq/L — ABNORMAL LOW (ref 137–147)

## 2014-01-07 LAB — GLUCOSE, CAPILLARY
Glucose-Capillary: 109 mg/dL — ABNORMAL HIGH (ref 70–99)
Glucose-Capillary: 115 mg/dL — ABNORMAL HIGH (ref 70–99)
Glucose-Capillary: 154 mg/dL — ABNORMAL HIGH (ref 70–99)
Glucose-Capillary: 125 mg/dL — ABNORMAL HIGH (ref 70–99)

## 2014-01-07 LAB — VANCOMYCIN, TROUGH: Vancomycin Tr: 10.1 ug/mL (ref 10.0–20.0)

## 2014-01-07 LAB — RPR

## 2014-01-07 LAB — HEPATITIS C ANTIBODY: HCV AB: NEGATIVE

## 2014-01-07 MED ORDER — MENTHOL 3 MG MT LOZG
1.0000 | LOZENGE | OROMUCOSAL | Status: DC | PRN
  Administered 2014-01-07: 3 mg via ORAL
  Filled 2014-01-07: qty 9

## 2014-01-07 MED ORDER — VANCOMYCIN HCL IN DEXTROSE 750-5 MG/150ML-% IV SOLN
750.0000 mg | Freq: Three times a day (TID) | INTRAVENOUS | Status: DC
  Administered 2014-01-07 – 2014-01-09 (×6): 750 mg via INTRAVENOUS
  Filled 2014-01-07 (×8): qty 150

## 2014-01-07 MED ORDER — METOPROLOL TARTRATE 25 MG PO TABS
25.0000 mg | ORAL_TABLET | Freq: Two times a day (BID) | ORAL | Status: DC
  Filled 2014-01-07: qty 1

## 2014-01-07 MED ORDER — DEXTROSE 5 % IV SOLN
320.0000 mg | Freq: Three times a day (TID) | INTRAVENOUS | Status: DC
  Administered 2014-01-07 – 2014-01-10 (×7): 320 mg via INTRAVENOUS
  Filled 2014-01-07 (×13): qty 20

## 2014-01-07 MED ORDER — METOPROLOL TARTRATE 50 MG PO TABS: 50.0000 mg | ORAL_TABLET | Freq: Two times a day (BID) | ORAL | Status: DC

## 2014-01-07 MED ORDER — IPRATROPIUM BROMIDE 0.02 % IN SOLN
0.5000 mg | Freq: Four times a day (QID) | RESPIRATORY_TRACT | Status: DC
  Administered 2014-01-07 – 2014-01-08 (×3): 0.5 mg via RESPIRATORY_TRACT
  Filled 2014-01-07 (×3): qty 2.5

## 2014-01-07 MED ORDER — IBUPROFEN 600 MG PO TABS
600.0000 mg | ORAL_TABLET | Freq: Once | ORAL | Status: AC
  Administered 2014-01-07: 600 mg via ORAL
  Filled 2014-01-07: qty 1

## 2014-01-07 MED ORDER — PREDNISONE 20 MG PO TABS
40.0000 mg | ORAL_TABLET | Freq: Every day | ORAL | Status: DC
  Administered 2014-01-08 – 2014-01-09 (×2): 40 mg via ORAL
  Filled 2014-01-07 (×3): qty 2

## 2014-01-07 MED ORDER — POTASSIUM CHLORIDE CRYS ER 20 MEQ PO TBCR
60.0000 meq | EXTENDED_RELEASE_TABLET | Freq: Once | ORAL | Status: AC
  Administered 2014-01-07: 60 meq via ORAL
  Filled 2014-01-07: qty 3

## 2014-01-07 MED ORDER — POLYETHYLENE GLYCOL 3350 17 G PO PACK
17.0000 g | PACK | Freq: Every day | ORAL | Status: DC
  Administered 2014-01-07 – 2014-01-10 (×4): 17 g via ORAL
  Filled 2014-01-07 (×5): qty 1

## 2014-01-07 MED ORDER — SODIUM CHLORIDE 0.9 % IV BOLUS (SEPSIS)
500.0000 mL | Freq: Once | INTRAVENOUS | Status: AC
  Administered 2014-01-07: 500 mL via INTRAVENOUS

## 2014-01-07 MED ORDER — IPRATROPIUM BROMIDE 0.02 % IN SOLN
0.5000 mg | Freq: Four times a day (QID) | RESPIRATORY_TRACT | Status: DC
  Administered 2014-01-07: 0.5 mg via RESPIRATORY_TRACT
  Filled 2014-01-07: qty 2.5

## 2014-01-07 MED ORDER — LEVALBUTEROL HCL 0.63 MG/3ML IN NEBU
0.6300 mg | INHALATION_SOLUTION | Freq: Four times a day (QID) | RESPIRATORY_TRACT | Status: DC
  Administered 2014-01-07 – 2014-01-08 (×4): 0.63 mg via RESPIRATORY_TRACT
  Filled 2014-01-07 (×8): qty 3

## 2014-01-07 MED ORDER — METOPROLOL TARTRATE 25 MG PO TABS
25.0000 mg | ORAL_TABLET | Freq: Two times a day (BID) | ORAL | Status: DC
  Administered 2014-01-07: 25 mg via ORAL
  Filled 2014-01-07 (×4): qty 1

## 2014-01-07 NOTE — Significant Event (Signed)
Rapid Response Event Note  Overview: Time Called: 6681 Arrival Time: 1750 Event Type: Hypotension  Initial Focused Assessment:  Called by RN for patient with hypotension.  Upon my arrival to patients room, RN and family at bedside.  Patient sitting in chair, alert and oriented.  Denies dizziness, SOB or pain.  BP 83/47, HR 94.    Interventions:  MD paged and order for bolus.     Event Summary:  RN to call if assistance needed   at      at          [facility], Harlin Rain

## 2014-01-07 NOTE — Progress Notes (Signed)
ANTIBIOTIC CONSULT NOTE - FOLLOW UP  Pharmacy Consult for Vancomycin and Cefepime Indication: pneumonia  Allergies  Allergen Reactions  . Aspirin Other (See Comments)    Blood sugar goes up  . Prednisone Other (See Comments)    Blood sugar goes up    Patient Measurements: Height: 5\' 4"  (162.6 cm) Weight: 147 lb 7.8 oz (66.9 kg) IBW/kg (Calculated) : 54.7  Vital Signs: Temp: 98.9 F (37.2 C) (12/13 0532) Temp Source: Oral (12/13 0532) BP: 131/88 mmHg (12/13 0532) Pulse Rate: 117 (12/13 0532) Intake/Output from previous day: 12/12 0701 - 12/13 0700 In: 1985.5 [P.O.:240; I.V.:1295.5; IV Piggyback:450] Out: 701 [Urine:700; Stool:1] Intake/Output from this shift:    Labs:  Recent Labs  01/05/14 1541 01/07/14 0459  WBC 8.0 7.6  HGB 9.6* 8.5*  PLT 96* 103*  CREATININE 0.96 0.65   Estimated Creatinine Clearance: 70.4 mL/min (by C-G formula based on Cr of 0.65).  Recent Labs  01/07/14 1020  [place] 10.1     Microbiology: Recent Results (from the past 720 hour(s))  Blood culture (routine x 2)     Status: None   Collection Time: 12/23/13  6:25 AM  Result Value Ref Range Status   Specimen Description BLOOD LEFT ARM  Final   Special Requests BOTTLES DRAWN AEROBIC ONLY 10CC  Final   Culture  Setup Time   Final    12/23/2013 17:55 Performed at [facility]    Culture   Final    NO GROWTH 5 DAYS Note: Culture results may be compromised due to an excessive volume of blood received in culture bottles. Performed at [facility]    Report Status 12/29/2013 FINAL  Final  Blood culture (routine x 2)     Status: None   Collection Time: 12/23/13  6:30 AM  Result Value Ref Range Status   Specimen Description BLOOD LEFT HAND  Final   Special Requests BOTTLES DRAWN AEROBIC AND ANAEROBIC 10CC  Final   Culture  Setup Time   Final    12/23/2013 17:55 Performed at [facility]    Culture   Final    NO GROWTH 5 DAYS Note: Culture results may  be compromised due to an excessive volume of blood received in culture bottles. Performed at [facility]    Report Status 12/29/2013 FINAL  Final  Blood Culture (routine x 2)     Status: None (Preliminary result)   Collection Time: 01/05/14  4:00 PM  Result Value Ref Range Status   Specimen Description BLOOD PORTA CATH  Final   Special Requests BOTTLES DRAWN AEROBIC AND ANAEROBIC 5CC  Final   Culture  Setup Time   Final    01/05/2014 22:41 Performed at [facility]    Culture   Final           BLOOD CULTURE RECEIVED NO GROWTH TO DATE CULTURE WILL BE HELD FOR 5 DAYS BEFORE ISSUING A FINAL NEGATIVE REPORT Performed at [facility]    Report Status PENDING  Incomplete  Blood Culture (routine x 2)     Status: None (Preliminary result)   Collection Time: 01/05/14  4:12 PM  Result Value Ref Range Status   Specimen Description BLOOD ARM RIGHT  Final   Special Requests BOTTLES DRAWN AEROBIC AND ANAEROBIC 5CC  Final   Culture  Setup Time   Final    01/05/2014 22:42 Performed at [place]   Final  BLOOD CULTURE RECEIVED NO GROWTH TO DATE CULTURE WILL BE HELD FOR 5 DAYS BEFORE ISSUING A FINAL NEGATIVE REPORT Performed at [facility]    Report Status PENDING  Incomplete  Urine culture     Status: None   Collection Time: 01/05/14  5:13 PM  Result Value Ref Range Status   Specimen Description URINE, CATHETERIZED  Final   Special Requests NONE  Final   Culture  Setup Time   Final    01/05/2014 17:45 Performed at [place] Performed at [facility]   Final   Culture NO GROWTH Performed at [facility]   Final   Report Status 01/06/2014 FINAL  Final    Anti-infectives    Start     Dose/Rate Route Frequency Ordered Stop   01/05/14 2200  ceFEPIme (MAXIPIME) 1 g in dextrose 5 % 50 mL IVPB     1 g100 mL/hr over 30 Minutes Intravenous 3 times per day 01/05/14 2109  01/13/14 2159   01/05/14 2200  vancomycin (VANCOCIN) IVPB 750 mg/150 ml premix     750 mg150 mL/hr over 60 Minutes Intravenous Every 12 hours 01/05/14 2154     01/05/14 2115  ceFEPIme (MAXIPIME) 1 g in dextrose 5 % 50 mL IVPB  Status:  Discontinued     1 g100 mL/hr over 30 Minutes Intravenous  Once 01/05/14 2108 01/05/14 2110      Assessment: 46 yoF currently undergoing chemo for B-cell lymphoma being treated for HCAP. Pharmacy consulted to dose Vancomycin and Cefepime.  Of note, patient has recently received levaquin for COPD exacerbation.  See below for culture status.  A Vancomycin trough drawn on 12/13 was 10.1 on Vancomycin 750 mg q12h, which is below the goal of 15-20.  Lab was drawn on-time. WBC WNL, Tmax 103, SCr has improved and she has consistent urine output. 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 CD4 count and RPR in process.  12/11 Strep pneumo>>Neg 12/11 Influenza>>Neg 12/11 Legionella>> 12/11 Bld Cx2>>NGTD 12/11 UCx>>NG 12/11 HIV Ab reactive, 1/2 confirmed positive  Goal of Therapy:  Vancomycin trough level 15-20 mcg/ml  Plan:  1) Increase Vancomycin to 750 mg q8h 2) Continue Cefepime 1g q8h 3) Monitor renal function, cultures, and check VT if indicated 4) Follow-up HIV screens, RPR, CD4 count - May need to consider adding coverage for possible opportunistic infections   Theron Arista, PharmD Clinical Pharmacist - Resident Pager: [phone] 12/13/201511:51 AM

## 2014-01-07 NOTE — Progress Notes (Signed)
VSS - Blood pressure 81/48, pulse 99, temperature 99.2 F (37.3 C), temperature source Oral, resp. rate 20, height 5\' 4"  (1.626 m), weight 66.9 kg (147 lb 7.8 oz), SpO2 100 %., O2   @ 2 L.  Re-checked manually and was 72/50 will sitting in chair.  Paged Dr. Hartford Poli & called rapid response RN.  Will continue to monitor and await for MD to call back.

## 2014-01-07 NOTE — Progress Notes (Signed)
[facility] PROGRESS NOTE   Bailey Moss ZOX:[id number] DOB: 04/09/1966 DOA: 01/05/2014 PCP: Honor Junes, MD  HPI/Subjective: Still complaining about shortness of breath, reported by the same as yesterday. HIV-1 antibodies positive. ID notified.  Assessment/Plan: Principal Problem:   HCAP (healthcare-associated pneumonia) Active Problems:   DM (diabetes mellitus)   Sepsis due to pneumonia   Lymphoma    Healthcare associated pneumonia Patient was recently in the hospital for COPD exacerbation discharge on levofloxacin. Comes back to the hospital with high fever and pneumonia symptoms, CT scan showed pneumonia. Treated as HCAP with cefepime and vancomycin. Supportive management with bronchodilators, mucolytics, antitussives and oxygen as needed. After the positive test of HIV, cannot rule out PCP, ID to evaluate. Not currently on a steroid.  Positive HIV Screening is positive for HIV, Dr. Megan Salon of ID notified. Elmon Kirschner in clinic labs reviewed, I can see screening for HIV, negative screening for HCV the in June 2014. Vital load, CD4 count, RPR and HCV ordered.  Sepsis secondary to pneumonia Temp of 101.7 and pulse of 125 in the presence of pneumonia. Blood, urine and sputum cultures obtained. Patient already on broad-spectrum antibiotics, hydrate with IV fluids and follow clinically.  Diabetes mellitus Has uncontrolled diabetes mellitus, will check hemoglobin A1c. Adjust her insulin regimen, carbohydrate modified diet and SSI while she is in the hospital.  Lymphoma Patient has active B-cell lymphoma and she is getting treatment for that. Next chemotherapy supposed to be on Monday 12/14 in [place], [place].  Code Status: Full code Family Communication: Plan discussed with the patient. Disposition Plan: Remains inpatient   Consultants:  None  Procedures:  None  Antibiotics:  Vancomycin and cefepime   Objective: Filed Vitals:   01/07/14  0532  BP: 131/88  Pulse: 117  Temp: 98.9 F (37.2 C)  Resp: 20    Intake/Output Summary (Last 24 hours) at 01/07/14 1242 Last data filed at 01/07/14 8119  Gross per 24 hour  Intake 1745.5 ml  Output    701 ml  Net 1044.5 ml   Filed Weights   01/05/14 2228  Weight: 66.9 kg (147 lb 7.8 oz)    Exam: General: Alert and awake, oriented x3, not in any acute distress. HEENT: anicteric sclera, pupils reactive to light and accommodation, EOMI CVS: S1-S2 clear, no murmur rubs or gallops Chest: Scattered rhonchi Abdomen: soft nontender, nondistended, normal bowel sounds, no organomegaly Extremities: no cyanosis, clubbing or edema noted bilaterally Neuro: Cranial nerves II-XII intact, no focal neurological deficits  Data Reviewed: Basic Metabolic Panel:  Recent Labs Lab 01/05/14 1541 01/07/14 0459  NA 128* 135*  K 4.4 3.5*  CL 91* 100  CO2 20 21  GLUCOSE 222* 79  BUN 20 19  CREATININE 0.96 0.65  CALCIUM 8.9 8.3*   Liver Function Tests:  Recent Labs Lab 01/05/14 1541  AST 50*  ALT 41*  ALKPHOS 103  BILITOT 0.5  PROT 6.8  ALBUMIN 2.6*   No results for input(s): LIPASE, AMYLASE in the last 168 hours. No results for input(s): AMMONIA in the last 168 hours. CBC:  Recent Labs Lab 01/05/14 1541 01/07/14 0459  WBC 8.0 7.6  NEUTROABS 5.4  --   HGB 9.6* 8.5*  HCT 28.1* 26.0*  MCV 90.4 90.0  PLT 96* 103*   Cardiac Enzymes:  Recent Labs Lab 01/05/14 1541  TROPONINI <0.30   BNP (last 3 results)  Recent Labs  12/22/13 2321  PROBNP 76.3   CBG:  Recent Labs Lab 01/06/14 0855 01/06/14 1233  01/06/14 1626 01/06/14 2212 01/07/14 0830  GLUCAP 92 83 100* 136* 109*    Micro Recent Results (from the past 240 hour(s))  Blood Culture (routine x 2)     Status: None (Preliminary result)   Collection Time: 01/05/14  4:00 PM  Result Value Ref Range Status   Specimen Description BLOOD PORTA CATH  Final   Special Requests BOTTLES DRAWN AEROBIC AND ANAEROBIC  5CC  Final   Culture  Setup Time   Final    01/05/2014 22:41 Performed at [facility]    Culture   Final           BLOOD CULTURE RECEIVED NO GROWTH TO DATE CULTURE WILL BE HELD FOR 5 DAYS BEFORE ISSUING A FINAL NEGATIVE REPORT Performed at [facility]    Report Status PENDING  Incomplete  Blood Culture (routine x 2)     Status: None (Preliminary result)   Collection Time: 01/05/14  4:12 PM  Result Value Ref Range Status   Specimen Description BLOOD ARM RIGHT  Final   Special Requests BOTTLES DRAWN AEROBIC AND ANAEROBIC 5CC  Final   Culture  Setup Time   Final    01/05/2014 22:42 Performed at [facility]    Culture   Final           BLOOD CULTURE RECEIVED NO GROWTH TO DATE CULTURE WILL BE HELD FOR 5 DAYS BEFORE ISSUING A FINAL NEGATIVE REPORT Performed at [facility]    Report Status PENDING  Incomplete  Urine culture     Status: None   Collection Time: 01/05/14  5:13 PM  Result Value Ref Range Status   Specimen Description URINE, CATHETERIZED  Final   Special Requests NONE  Final   Culture  Setup Time   Final    01/05/2014 17:45 Performed at [place] Performed at [facility]   Final   Culture NO GROWTH Performed at [facility]   Final   Report Status 01/06/2014 FINAL  Final     Studies: Ct Angio Chest Pe W/cm &/or Wo Cm  01/05/2014   CLINICAL DATA:  Two day history of shortness of breath.  EXAM: CT ANGIOGRAPHY CHEST WITH CONTRAST  TECHNIQUE: Multidetector CT imaging of the chest was performed using the standard protocol during bolus administration of intravenous contrast. Multiplanar CT image reconstructions and MIPs were obtained to evaluate the vascular anatomy.  CONTRAST:  176mL OMNIPAQUE IOHEXOL 350 MG/ML IV.  COMPARISON:  CTA chest 12/23/2013, 09/08/2013.  FINDINGS: As on the examination 2 weeks ago, no filling defects within either main pulmonary artery or their branches in  either lung. Heart size remains normal. No pericardial effusion. No visible coronary atherosclerosis. No visible atherosclerosis involving the thoracic aorta.  Scattered patchy ground-glass airspace opacities throughout both lungs, most prominent in the upper lobes, new since the prior examination. No pulmonary parenchymal nodules or masses. No pleural effusions. Linear scarring medially in the deep right lower lobe.  No significant mediastinal or hilar lymphadenopathy. 2.6 x 1.4 cm and 2.6 x 1.5 cm respectively, unchanged since the examination 2 weeks ago, significantly improved since the examination in August. No evidence of right axillary lymphadenopathy.  Approximate 3.5 cm accessory splenule in the left upper quadrant of the visualized abdomen as noted previously. Visualized upper abdomen unremarkable for the early arterial phase of enhancement. Bone window images unremarkable.  Review of the MIP images confirms the above findings.  IMPRESSION: 1.  No evidence of pulmonary embolism. 2. Ground-glass airspace opacities in both lungs, most prominent in the upper lobes, likely inflammatory or infectious. 3. Stable left axillary lymphadenopathy since the examination 2 weeks ago, much improved since August. No lymphadenopathy elsewhere.   Electronically Signed   By: Evangeline Dakin M.D.   On: 01/05/2014 20:08   Dg Chest Port 1 View  01/05/2014   CLINICAL DATA:  58 year old female with a two-day history of fever  EXAM: PORTABLE CHEST - 1 VIEW  COMPARISON:  Prior chest x-ray 12/15/2013  FINDINGS: Right IJ approach single-lumen power injectable port catheter. Catheter tip in good position in the upper right atrium. Cardiac and mediastinal contours are within normal limits. Minimal bibasilar atelectasis unchanged compared to recent prior. No new focal airspace consolidation, pulmonary edema, pleural effusion or pneumothorax. No acute osseous abnormality.  IMPRESSION: 1. Low inspiratory volumes with minimal bibasilar  atelectasis. 2. No acute cardiopulmonary process. 3. Right IJ approach single-lumen power injectable port catheter with the tip in good position overlying the upper right atrium.   Electronically Signed   By: Jacqulynn Cadet M.D.   On: 01/05/2014 16:09    Scheduled Meds: . antiseptic oral rinse  7 mL Mouth Rinse q12n4p  . ceFEPime (MAXIPIME) IV  1 g Intravenous 3 times per day  . folic acid  1 mg Oral Daily  . guaiFENesin  1,200 mg Oral BID  . heparin  5,000 Units Subcutaneous 3 times per day  . insulin aspart  0-15 Units Subcutaneous TID WC  . insulin detemir  10 Units Subcutaneous QPM  . ipratropium-albuterol  3 mL Nebulization TID  . lactose free nutrition  237 mL Oral Q24H  . mometasone-formoterol  2 puff Inhalation BID  . polyethylene glycol  17 g Oral Daily  . vancomycin  750 mg Intravenous Q8H   Continuous Infusions: . sodium chloride 75 mL/hr at 01/07/14 1540       Time spent: 35 minutes    [facility]  [facility] Pager [phone] If 7PM-7AM, please contact night-coverage at [email or web address], password [facility] 01/07/2014, 12:42 PM  LOS: 2 days

## 2014-01-07 NOTE — Consult Note (Signed)
[facility] for Infectious Disease    Date of Admission:  01/05/2014           Day 3 vancomycin        Day 3 cefepime       Reason for Consult: HIV infection and non-resolving pneumonia in setting of recent chemotherapy for lymphoma   Referring Physician: Dr. Verlee Monte   Principal Problem:   HCAP (healthcare-associated pneumonia) Active Problems:   HIV disease   DM (diabetes mellitus)   H/O: CVA (cerebrovascular accident)   Rheumatoid arthritis   Lymphoma   . antiseptic oral rinse  7 mL Mouth Rinse q12n4p  . ceFEPime (MAXIPIME) IV  1 g Intravenous 3 times per day  . folic acid  1 mg Oral Daily  . guaiFENesin  1,200 mg Oral BID  . heparin  5,000 Units Subcutaneous 3 times per day  . ibuprofen  600 mg Oral Once  . insulin aspart  0-15 Units Subcutaneous TID WC  . insulin detemir  10 Units Subcutaneous QPM  . ipratropium  0.5 mg Nebulization QID  . lactose free nutrition  237 mL Oral Q24H  . levalbuterol  0.63 mg Nebulization Q6H  . metoprolol tartrate  25 mg Oral BID  . mometasone-formoterol  2 puff Inhalation BID  . polyethylene glycol  17 g Oral Daily  . vancomycin  750 mg Intravenous Q8H    Recommendations: 1. Started IV trimethoprim-sulfamethoxazole 2. Prednisone 40 mg daily 3. Continue vancomycin and cefepime for now 4. Check serum LDH, CD4 count and HIV viral load 5. Attempt to collect expectorated sputum for pneumocystis antigen 6. Recommend daily room air oxygen saturation while ambulating   Assessment: A very concerned that she may have it bandaged HIV infection now complicated by pneumocystis pneumonia. She does not think she will be able to produce any sputum. I will go ahead and start empiric trimethoprim sulfamethoxazole and prednisone pending further laboratory evaluation.    HPI: Bailey Moss is a 58 y.o. female with history of asthma, diabetes and rheumatoid arthritis. She was hospitalized here in August with an exacerbation of  her asthma. At that time she was noted to have generalized adenopathy. She chose to have that evaluated when she returned home to [place], [place]. She was found to have B-cell lymphoma and has been undergoing chemotherapy. She was back to [facility] recently visiting with her daughter and was readmitted to the hospital in late November with shortness of breath. This was felt to be another exacerbation of asthma. She was treated with levofloxacin and prednisone with some transient improvement. However she had to be readmitted on December 11 because of fever, dry cough and severe dyspnea on exertion. Her resting room air O2 sat on admission was 90%. She was started on empiric therapy for presumed HCAP. Her chest x-ray and chest CT scan showed diffuse, interstitial, groundglass infiltrates. She's continued to be short of breath and spike fevers. Her HIV antibody and confirmatory test are positive.  Records in [place] do not indicate previous HIV testing during her recent evaluations in [place]. She is unaware of ever being tested before. She is married but her husband has been incarcerated for at least 50 years. She is unaware of his current health condition. She's not been sexually active with anyone else since he was incarcerated. She has no history of transfusions, or injecting drug use.   Review of Systems: Constitutional: positive for anorexia, chills, fatigue, fevers and weight  loss, negative for sweats Eyes: negative Ears, nose, mouth, throat, and face: negative Respiratory: positive for asthma, cough, dyspnea on exertion and wheezing, negative for hemoptysis, pleurisy/chest pain and sputum Cardiovascular: negative Gastrointestinal: negative Genitourinary:negative  Past Medical History  Diagnosis Date  . Hypertension   . Type II diabetes mellitus   . Chronic bronchitis     "get it q yr" (09/08/2013)  . Pneumonia 2013; 2014; 2015  . Asthma   . Asthmatic bronchitis , chronic   .  Anemia   . Stroke "~ 04/2013    "memory problems; weak all over since" (09/08/2013)  . Arthritis     "all my body"  . Memory problem   . Cancer     History  Substance Use Topics  . Smoking status: Never Smoker   . Smokeless tobacco: Never Used  . Alcohol Use: No    History reviewed. No pertinent family history. Allergies  Allergen Reactions  . Aspirin Other (See Comments)    Blood sugar goes up  . Prednisone Other (See Comments)    Blood sugar goes up    OBJECTIVE: Blood pressure 124/76, pulse 127, temperature 102.7 F (39.3 C), temperature source Oral, resp. rate 20, height 5\' 4"  (1.626 m), weight 147 lb 7.8 oz (66.9 kg), SpO2 98 %. General: she is weak and tachypneic wearing supplemental oxygen Skin: dry skin Lymph nodes: Shotty axillary adenopathy Oral: No thrush or other oropharyngeal lesions Port-A-Cath site: Normal Lungs: scattered crackles and faint wheezes Cor: tachycardia with regular S1 and S2 no murmurs Abdomen: soft and nontender with no palpable masses  Lab Results Lab Results  Component Value Date   WBC 7.6 01/07/2014   HGB 8.5* 01/07/2014   HCT 26.0* 01/07/2014   MCV 90.0 01/07/2014   PLT 103* 01/07/2014    Lab Results  Component Value Date   CREATININE 0.65 01/07/2014   BUN 19 01/07/2014   NA 135* 01/07/2014   K 3.5* 01/07/2014   CL 100 01/07/2014   CO2 21 01/07/2014    Lab Results  Component Value Date   ALT 41* 01/05/2014   AST 50* 01/05/2014   ALKPHOS 103 01/05/2014   BILITOT 0.5 01/05/2014     Microbiology: Recent Results (from the past 240 hour(s))  Blood Culture (routine x 2)     Status: None (Preliminary result)   Collection Time: 01/05/14  4:00 PM  Result Value Ref Range Status   Specimen Description BLOOD PORTA CATH  Final   Special Requests BOTTLES DRAWN AEROBIC AND ANAEROBIC 5CC  Final   Culture  Setup Time   Final    01/05/2014 22:41 Performed at [facility]    Culture   Final           BLOOD CULTURE  RECEIVED NO GROWTH TO DATE CULTURE WILL BE HELD FOR 5 DAYS BEFORE ISSUING A FINAL NEGATIVE REPORT Performed at [facility]    Report Status PENDING  Incomplete  Blood Culture (routine x 2)     Status: None (Preliminary result)   Collection Time: 01/05/14  4:12 PM  Result Value Ref Range Status   Specimen Description BLOOD ARM RIGHT  Final   Special Requests BOTTLES DRAWN AEROBIC AND ANAEROBIC 5CC  Final   Culture  Setup Time   Final    01/05/2014 22:42 Performed at [facility]    Culture   Final           BLOOD CULTURE RECEIVED NO GROWTH TO DATE CULTURE WILL BE  HELD FOR 5 DAYS BEFORE ISSUING A FINAL NEGATIVE REPORT Performed at [facility]    Report Status PENDING  Incomplete  Urine culture     Status: None   Collection Time: 01/05/14  5:13 PM  Result Value Ref Range Status   Specimen Description URINE, CATHETERIZED  Final   Special Requests NONE  Final   Culture  Setup Time   Final    01/05/2014 17:45 Performed at [place] Performed at [facility]   Final   Culture NO GROWTH Performed at [facility]   Final   Report Status 01/06/2014 FINAL  Final    Michel Bickers, MD [place] [facility] [phone] pager   [phone] cell 01/07/2014, 3:04 PM

## 2014-01-07 NOTE — Progress Notes (Signed)
ANTIBIOTIC CONSULT NOTE - FOLLOW UP  Pharmacy Consult for Trimethoprim/Sulfamethoxazole Indication: Pneumocystis pneumonia  Allergies  Allergen Reactions  . Aspirin Other (See Comments)    Blood sugar goes up    Patient Measurements: Height: 5\' 4"  (162.6 cm) Weight: 147 lb 7.8 oz (66.9 kg) IBW/kg (Calculated) : 54.7  Vital Signs: Temp: 102.7 F (39.3 C) (12/13 1444) Temp Source: Oral (12/13 1444) BP: 124/76 mmHg (12/13 1321) Pulse Rate: 127 (12/13 1321) Intake/Output from previous day: 12/12 0701 - 12/13 0700 In: 1985.5 [P.O.:240; I.V.:1295.5; IV Piggyback:450] Out: 701 [Urine:700; Stool:1] Intake/Output from this shift:    Labs:  Recent Labs  01/05/14 1541 01/07/14 0459  WBC 8.0 7.6  HGB 9.6* 8.5*  PLT 96* 103*  CREATININE 0.96 0.65   Estimated Creatinine Clearance: 70.4 mL/min (by C-G formula based on Cr of 0.65).  Recent Labs  01/07/14 1020  [place] 10.1     Microbiology: Recent Results (from the past 720 hour(s))  Blood culture (routine x 2)     Status: None   Collection Time: 12/23/13  6:25 AM  Result Value Ref Range Status   Specimen Description BLOOD LEFT ARM  Final   Special Requests BOTTLES DRAWN AEROBIC ONLY 10CC  Final   Culture  Setup Time   Final    12/23/2013 17:55 Performed at [facility]    Culture   Final    NO GROWTH 5 DAYS Note: Culture results may be compromised due to an excessive volume of blood received in culture bottles. Performed at [facility]    Report Status 12/29/2013 FINAL  Final  Blood culture (routine x 2)     Status: None   Collection Time: 12/23/13  6:30 AM  Result Value Ref Range Status   Specimen Description BLOOD LEFT HAND  Final   Special Requests BOTTLES DRAWN AEROBIC AND ANAEROBIC 10CC  Final   Culture  Setup Time   Final    12/23/2013 17:55 Performed at [facility]    Culture   Final    NO GROWTH 5 DAYS Note: Culture results may be compromised due to an excessive volume  of blood received in culture bottles. Performed at [facility]    Report Status 12/29/2013 FINAL  Final  Blood Culture (routine x 2)     Status: None (Preliminary result)   Collection Time: 01/05/14  4:00 PM  Result Value Ref Range Status   Specimen Description BLOOD PORTA CATH  Final   Special Requests BOTTLES DRAWN AEROBIC AND ANAEROBIC 5CC  Final   Culture  Setup Time   Final    01/05/2014 22:41 Performed at [facility]    Culture   Final           BLOOD CULTURE RECEIVED NO GROWTH TO DATE CULTURE WILL BE HELD FOR 5 DAYS BEFORE ISSUING A FINAL NEGATIVE REPORT Performed at [facility]    Report Status PENDING  Incomplete  Blood Culture (routine x 2)     Status: None (Preliminary result)   Collection Time: 01/05/14  4:12 PM  Result Value Ref Range Status   Specimen Description BLOOD ARM RIGHT  Final   Special Requests BOTTLES DRAWN AEROBIC AND ANAEROBIC 5CC  Final   Culture  Setup Time   Final    01/05/2014 22:42 Performed at [facility]    Culture   Final           BLOOD CULTURE RECEIVED NO GROWTH TO DATE CULTURE WILL BE  HELD FOR 5 DAYS BEFORE ISSUING A FINAL NEGATIVE REPORT Performed at [facility]    Report Status PENDING  Incomplete  Urine culture     Status: None   Collection Time: 01/05/14  5:13 PM  Result Value Ref Range Status   Specimen Description URINE, CATHETERIZED  Final   Special Requests NONE  Final   Culture  Setup Time   Final    01/05/2014 17:45 Performed at [place] Performed at [facility]   Final   Culture NO GROWTH Performed at [facility]   Final   Report Status 01/06/2014 FINAL  Final    Anti-infectives    Start     Dose/Rate Route Frequency Ordered Stop   01/07/14 1843  vancomycin (VANCOCIN) IVPB 750 mg/150 ml premix     750 mg150 mL/hr over 60 Minutes Intravenous Every 8 hours 01/07/14 1153     01/05/14 2200  ceFEPIme (MAXIPIME) 1 g in  dextrose 5 % 50 mL IVPB     1 g100 mL/hr over 30 Minutes Intravenous 3 times per day 01/05/14 2109 01/13/14 2159   01/05/14 2200  vancomycin (VANCOCIN) IVPB 750 mg/150 ml premix  Status:  Discontinued     750 mg150 mL/hr over 60 Minutes Intravenous Every 12 hours 01/05/14 2154 01/07/14 1153   01/05/14 2115  ceFEPIme (MAXIPIME) 1 g in dextrose 5 % 50 mL IVPB  Status:  Discontinued     1 g100 mL/hr over 30 Minutes Intravenous  Once 01/05/14 2108 01/05/14 2110      Assessment: 58 year old female admitted with pneumonia.  She is currently undergoing treatment for B-cell lymphoma and her HIV antibody and antibody confirmation are positive.  She is to begin therapy with IV trimethoprim/sulfamethoxazole pending additional test results for possible Pneumocystis pneumonia.  Her renal function is normal.  Plan:  Septra 320mg  (of trimethoprim component) IV q8h Monitor renal function closely while on Septra Await CD4 count and viral load  Legrand Como, Pharm.D., BCPS, AAHIVP Clinical Pharmacist Phone: [phone] or [phone] 01/07/2014, 3:46 PM

## 2014-01-08 DIAGNOSIS — Z8673 Personal history of transient ischemic attack (TIA), and cerebral infarction without residual deficits: Secondary | ICD-10-CM

## 2014-01-08 DIAGNOSIS — D649 Anemia, unspecified: Secondary | ICD-10-CM | POA: Diagnosis present

## 2014-01-08 DIAGNOSIS — B59 Pneumocystosis: Secondary | ICD-10-CM | POA: Insufficient documentation

## 2014-01-08 DIAGNOSIS — C8510 Unspecified B-cell lymphoma, unspecified site: Secondary | ICD-10-CM

## 2014-01-08 LAB — BASIC METABOLIC PANEL
ANION GAP: 13 (ref 5–15)
BUN: 14 mg/dL (ref 6–23)
CHLORIDE: 99 meq/L (ref 96–112)
CO2: 19 mEq/L (ref 19–32)
Calcium: 8.4 mg/dL (ref 8.4–10.5)
Creatinine, Ser: 0.67 mg/dL (ref 0.50–1.10)
Glucose, Bld: 188 mg/dL — ABNORMAL HIGH (ref 70–99)
POTASSIUM: 3.8 meq/L (ref 3.7–5.3)
Sodium: 131 mEq/L — ABNORMAL LOW (ref 137–147)

## 2014-01-08 LAB — GLUCOSE, CAPILLARY
GLUCOSE-CAPILLARY: 74 mg/dL (ref 70–99)
GLUCOSE-CAPILLARY: 260 mg/dL — AB (ref 70–99)
Glucose-Capillary: 232 mg/dL — ABNORMAL HIGH (ref 70–99)
Glucose-Capillary: 385 mg/dL — ABNORMAL HIGH (ref 70–99)

## 2014-01-08 LAB — CBC
HCT: 23.1 % — ABNORMAL LOW (ref 36.0–46.0)
HEMOGLOBIN: 7.7 g/dL — AB (ref 12.0–15.0)
MCH: 29.8 pg (ref 26.0–34.0)
MCHC: 33.3 g/dL (ref 30.0–36.0)
MCV: 89.5 fL (ref 78.0–100.0)
PLATELETS: 92 10*3/uL — AB (ref 150–400)
RBC: 2.58 MIL/uL — AB (ref 3.87–5.11)
RDW: 15.9 % — ABNORMAL HIGH (ref 11.5–15.5)
WBC: 5 10*3/uL (ref 4.0–10.5)

## 2014-01-08 LAB — PROTIME-INR
INR: 1.21 (ref 0.00–1.49)
PROTHROMBIN TIME: 15.5 s — AB (ref 11.6–15.2)

## 2014-01-08 LAB — PREPARE RBC (CROSSMATCH)

## 2014-01-08 LAB — ABO/RH: ABO/RH(D): B POS

## 2014-01-08 LAB — T-HELPER CELLS (CD4) COUNT (NOT AT ARMC)
CD4 % Helper T Cell: 17 % — ABNORMAL LOW (ref 33–55)
CD4 T Cell Abs: 70 /uL — ABNORMAL LOW (ref 400–2700)

## 2014-01-08 LAB — LACTATE DEHYDROGENASE: LDH: 308 U/L — AB (ref 94–250)

## 2014-01-08 MED ORDER — LEVALBUTEROL HCL 0.63 MG/3ML IN NEBU: 0.6300 mg | INHALATION_SOLUTION | RESPIRATORY_TRACT | Status: DC | PRN

## 2014-01-08 MED ORDER — PNEUMOCOCCAL 13-VAL CONJ VACC IM SUSP
0.5000 mL | INTRAMUSCULAR | Status: AC
  Administered 2014-01-09: 0.5 mL via INTRAMUSCULAR
  Filled 2014-01-08: qty 0.5

## 2014-01-08 MED ORDER — FUROSEMIDE 10 MG/ML IJ SOLN
20.0000 mg | Freq: Once | INTRAMUSCULAR | Status: AC
  Administered 2014-01-09: 20 mg via INTRAVENOUS
  Filled 2014-01-08: qty 2

## 2014-01-08 MED ORDER — LEVALBUTEROL HCL 0.63 MG/3ML IN NEBU
0.6300 mg | INHALATION_SOLUTION | RESPIRATORY_TRACT | Status: DC
  Administered 2014-01-08 – 2014-01-09 (×6): 0.63 mg via RESPIRATORY_TRACT
  Filled 2014-01-08 (×11): qty 3

## 2014-01-08 MED ORDER — IPRATROPIUM BROMIDE 0.02 % IN SOLN
RESPIRATORY_TRACT | Status: AC
  Filled 2014-01-08: qty 2.5

## 2014-01-08 MED ORDER — IPRATROPIUM BROMIDE 0.02 % IN SOLN
0.5000 mg | RESPIRATORY_TRACT | Status: DC
  Administered 2014-01-08 – 2014-01-09 (×6): 0.5 mg via RESPIRATORY_TRACT
  Filled 2014-01-08 (×5): qty 2.5

## 2014-01-08 MED ORDER — LEVALBUTEROL HCL 0.63 MG/3ML IN NEBU
INHALATION_SOLUTION | RESPIRATORY_TRACT | Status: AC
  Filled 2014-01-08: qty 3

## 2014-01-08 MED ORDER — POTASSIUM CHLORIDE CRYS ER 20 MEQ PO TBCR
40.0000 meq | EXTENDED_RELEASE_TABLET | Freq: Once | ORAL | Status: AC
  Administered 2014-01-08: 40 meq via ORAL
  Filled 2014-01-08: qty 2

## 2014-01-08 MED ORDER — METOPROLOL TARTRATE 12.5 MG HALF TABLET
12.5000 mg | ORAL_TABLET | Freq: Two times a day (BID) | ORAL | Status: DC
  Administered 2014-01-08 – 2014-01-11 (×6): 12.5 mg via ORAL
  Filled 2014-01-08 (×8): qty 1

## 2014-01-08 MED ORDER — INFLUENZA VAC SPLIT QUAD 0.5 ML IM SUSY
0.5000 mL | PREFILLED_SYRINGE | INTRAMUSCULAR | Status: AC
  Administered 2014-01-09: 0.5 mL via INTRAMUSCULAR
  Filled 2014-01-08: qty 0.5

## 2014-01-08 MED ORDER — SODIUM CHLORIDE 0.9 % IV SOLN: Freq: Once | INTRAVENOUS | Status: DC

## 2014-01-08 NOTE — Progress Notes (Addendum)
[facility] PROGRESS NOTE   Bailey Moss RKY:[id number] DOB: 10-10-1955 DOA: 01/05/2014 PCP: Honor Junes, MD  HPI/Subjective: Feels much better since yesterday, breathing is easier and less cough.  Assessment/Plan: Principal Problem:   HCAP (healthcare-associated pneumonia) Active Problems:   DM (diabetes mellitus)   H/O: CVA (cerebrovascular accident)   Rheumatoid arthritis   Lymphoma   HIV disease   Anemia    Healthcare associated pneumonia Patient was recently in the hospital for COPD exacerbation discharge on levofloxacin. Comes back to the hospital with high fever and pneumonia symptoms, CT scan showed pneumonia. Treated as HCAP with cefepime and vancomycin. Supportive management with bronchodilators, mucolytics, antitussives and oxygen as needed. Felt better this morning, might be because of the Bactrim or the steroid, continue current regimen.  HIV/AIDS Screening is positive for HIV, Dr. Megan Salon of [facility] notified. [facility] labs reviewed, I can not see screening for HIV, negative screening for HCV the in June 2014. Negative for RPR and HCV, HIV viral load is pending. CD4 count is 70  Sepsis secondary to pneumonia Temp of 101.7 and pulse of 125 in the presence of pneumonia. Blood, urine and sputum cultures obtained. Patient already on broad-spectrum antibiotics, hydrate with IV fluids and follow clinically.  Diabetes mellitus Has uncontrolled diabetes mellitus, will check hemoglobin A1c. Adjust her insulin regimen, carbohydrate modified diet and SSI while she is in the hospital.  Lymphoma Patient has active B-cell lymphoma and she is getting treatment for that. Next chemotherapy supposed to be on Monday 12/14 in [place], [place].  Anemia  Hemoglobin dropping since admission, likely secondary to hydration with IV fluids. I will transfuse 1 unit of packed RBCs for hemoglobin of 7.7.  Code Status: Full code Family Communication: Plan discussed  with the patient. Disposition Plan: Remains inpatient   Consultants:  None  Procedures:  None  Antibiotics:  Vancomycin and cefepime   Objective: Filed Vitals:   01/08/14 1233  BP:   Pulse:   Temp: 100 F (37.8 C)  Resp:     Intake/Output Summary (Last 24 hours) at 01/08/14 1256 Last data filed at 01/08/14 0900  Gross per 24 hour  Intake    120 ml  Output    400 ml  Net   -280 ml   Filed Weights   01/05/14 2228  Weight: 66.9 kg (147 lb 7.8 oz)    Exam: General: Alert and awake, oriented x3, not in any acute distress. HEENT: anicteric sclera, pupils reactive to light and accommodation, EOMI CVS: S1-S2 clear, no murmur rubs or gallops Chest: Scattered rhonchi Abdomen: soft nontender, nondistended, normal bowel sounds, no organomegaly Extremities: no cyanosis, clubbing or edema noted bilaterally Neuro: Cranial nerves II-XII intact, no focal neurological deficits  Data Reviewed: Basic Metabolic Panel:  Recent Labs Lab 01/05/14 1541 01/07/14 0459 01/08/14 0400  NA 128* 135* 131*  K 4.4 3.5* 3.8  CL 91* 100 99  CO2 20 21 19   GLUCOSE 222* 79 188*  BUN 20 19 14   CREATININE 0.96 0.65 0.67  CALCIUM 8.9 8.3* 8.4   Liver Function Tests:  Recent Labs Lab 01/05/14 1541  AST 50*  ALT 41*  ALKPHOS 103  BILITOT 0.5  PROT 6.8  ALBUMIN 2.6*   No results for input(s): LIPASE, AMYLASE in the last 168 hours. No results for input(s): AMMONIA in the last 168 hours. CBC:  Recent Labs Lab 01/05/14 1541 01/07/14 0459 01/08/14 0400  WBC 8.0 7.6 5.0  NEUTROABS 5.4  --   --  HGB 9.6* 8.5* 7.7*  HCT 28.1* 26.0* 23.1*  MCV 90.4 90.0 89.5  PLT 96* 103* 92*   Cardiac Enzymes:  Recent Labs Lab 01/05/14 1541  TROPONINI <0.30   BNP (last 3 results)  Recent Labs  12/22/13 2321  PROBNP 76.3   CBG:  Recent Labs Lab 01/07/14 1229 01/07/14 1740 01/07/14 2137 01/08/14 0809 01/08/14 1149  GLUCAP 115* 154* 125* 74 232*    Micro Recent  Results (from the past 240 hour(s))  Blood Culture (routine x 2)     Status: None (Preliminary result)   Collection Time: 01/05/14  4:00 PM  Result Value Ref Range Status   Specimen Description BLOOD PORTA CATH  Final   Special Requests BOTTLES DRAWN AEROBIC AND ANAEROBIC 5CC  Final   Culture  Setup Time   Final    01/05/2014 22:41 Performed at [facility]    Culture   Final           BLOOD CULTURE RECEIVED NO GROWTH TO DATE CULTURE WILL BE HELD FOR 5 DAYS BEFORE ISSUING A FINAL NEGATIVE REPORT Performed at [facility]    Report Status PENDING  Incomplete  Blood Culture (routine x 2)     Status: None (Preliminary result)   Collection Time: 01/05/14  4:12 PM  Result Value Ref Range Status   Specimen Description BLOOD ARM RIGHT  Final   Special Requests BOTTLES DRAWN AEROBIC AND ANAEROBIC 5CC  Final   Culture  Setup Time   Final    01/05/2014 22:42 Performed at [facility]    Culture   Final           BLOOD CULTURE RECEIVED NO GROWTH TO DATE CULTURE WILL BE HELD FOR 5 DAYS BEFORE ISSUING A FINAL NEGATIVE REPORT Performed at [facility]    Report Status PENDING  Incomplete  Urine culture     Status: None   Collection Time: 01/05/14  5:13 PM  Result Value Ref Range Status   Specimen Description URINE, CATHETERIZED  Final   Special Requests NONE  Final   Culture  Setup Time   Final    01/05/2014 17:45 Performed at [place] Performed at [facility]   Final   Culture NO GROWTH Performed at [facility]   Final   Report Status 01/06/2014 FINAL  Final  Culture, blood (routine x 2)     Status: None (Preliminary result)   Collection Time: 01/07/14  2:56 PM  Result Value Ref Range Status   Specimen Description BLOOD LEFT HAND  Final   Special Requests BOTTLES DRAWN AEROBIC AND ANAEROBIC 5 CC  Final   Culture  Setup Time   Final    01/07/2014 20:59 Performed at [facility]     Culture   Final           BLOOD CULTURE RECEIVED NO GROWTH TO DATE CULTURE WILL BE HELD FOR 5 DAYS BEFORE ISSUING A FINAL NEGATIVE REPORT Performed at [facility]    Report Status PENDING  Incomplete  Culture, blood (routine x 2)     Status: None (Preliminary result)   Collection Time: 01/07/14  3:01 PM  Result Value Ref Range Status   Specimen Description BLOOD RIGHT ARM  Final   Special Requests BOTTLES DRAWN AEROBIC AND ANAEROBIC 5 CC  Final   Culture  Setup Time   Final    01/07/2014 20:59 Performed at [facility]    Culture   Final           BLOOD CULTURE RECEIVED NO GROWTH TO DATE CULTURE WILL BE HELD FOR 5 DAYS BEFORE ISSUING A FINAL NEGATIVE REPORT Performed at [facility]    Report Status PENDING  Incomplete     Studies: No results found.  Scheduled Meds: . antiseptic oral rinse  7 mL Mouth Rinse q12n4p  . ceFEPime (MAXIPIME) IV  1 g Intravenous 3 times per day  . folic acid  1 mg Oral Daily  . guaiFENesin  1,200 mg Oral BID  . heparin  5,000 Units Subcutaneous 3 times per day  . insulin aspart  0-15 Units Subcutaneous TID WC  . insulin detemir  10 Units Subcutaneous QPM  . ipratropium  0.5 mg Nebulization Q4H WA  . lactose free nutrition  237 mL Oral Q24H  . levalbuterol  0.63 mg Nebulization Q4H WA  . metoprolol tartrate  12.5 mg Oral BID  . mometasone-formoterol  2 puff Inhalation BID  . polyethylene glycol  17 g Oral Daily  . predniSONE  40 mg Oral Q breakfast  . sulfamethoxazole-trimethoprim  320 mg Intravenous Q8H  . vancomycin  750 mg Intravenous Q8H   Continuous Infusions: . sodium chloride 800 mL (01/07/14 1332)       Time spent: 35 minutes    [facility]  [facility] Pager [phone] If 7PM-7AM, please contact night-coverage at [email or web address], password [facility] 01/08/2014, 12:56 PM  LOS: 3 days

## 2014-01-08 NOTE — Progress Notes (Signed)
INFECTIOUS DISEASE PROGRESS NOTE  ID: Bailey Moss is a 58 y.o. female with  Principal Problem:   HCAP (healthcare-associated pneumonia) Active Problems:   DM (diabetes mellitus)   H/O: CVA (cerebrovascular accident)   Rheumatoid arthritis   Lymphoma   HIV disease   Anemia  Subjective: breathing better, awaiting evening treatment  Abtx:  Anti-infectives    Start     Dose/Rate Route Frequency Ordered Stop   01/07/14 1843  vancomycin (VANCOCIN) IVPB 750 mg/150 ml premix     750 mg150 mL/hr over 60 Minutes Intravenous Every 8 hours 01/07/14 1153     01/07/14 1700  sulfamethoxazole-trimethoprim (BACTRIM) 320 mg in dextrose 5 % 500 mL IVPB     320 mg346.7 mL/hr over 90 Minutes Intravenous Every 8 hours 01/07/14 1546     01/05/14 2200  ceFEPIme (MAXIPIME) 1 g in dextrose 5 % 50 mL IVPB     1 g100 mL/hr over 30 Minutes Intravenous 3 times per day 01/05/14 2109 01/13/14 2159   01/05/14 2200  vancomycin (VANCOCIN) IVPB 750 mg/150 ml premix  Status:  Discontinued     750 mg150 mL/hr over 60 Minutes Intravenous Every 12 hours 01/05/14 2154 01/07/14 1153   01/05/14 2115  ceFEPIme (MAXIPIME) 1 g in dextrose 5 % 50 mL IVPB  Status:  Discontinued     1 g100 mL/hr over 30 Minutes Intravenous  Once 01/05/14 2108 01/05/14 2110      Medications:  Scheduled: . sodium chloride   Intravenous Once  . antiseptic oral rinse  7 mL Mouth Rinse q12n4p  . ceFEPime (MAXIPIME) IV  1 g Intravenous 3 times per day  . folic acid  1 mg Oral Daily  . furosemide  20 mg Intravenous Once  . guaiFENesin  1,200 mg Oral BID  . heparin  5,000 Units Subcutaneous 3 times per day  . insulin aspart  0-15 Units Subcutaneous TID WC  . insulin detemir  10 Units Subcutaneous QPM  . ipratropium  0.5 mg Nebulization Q4H WA  . lactose free nutrition  237 mL Oral Q24H  . levalbuterol  0.63 mg Nebulization Q4H WA  . metoprolol tartrate  12.5 mg Oral BID  . mometasone-formoterol  2 puff Inhalation BID  . polyethylene  glycol  17 g Oral Daily  . predniSONE  40 mg Oral Q breakfast  . sulfamethoxazole-trimethoprim  320 mg Intravenous Q8H  . vancomycin  750 mg Intravenous Q8H    Objective: Vital signs in last 24 hours: Temp:  [97.3 F (36.3 C)-100 F (37.8 C)] 100 F (37.8 C) (12/14 1300) Pulse Rate:  [83-112] 112 (12/14 1300) Resp:  [18-24] 24 (12/14 1300) BP: (77-113)/(47-70) 99/51 mmHg (12/14 1300) SpO2:  [96 %-100 %] 97 % (12/14 1617)   General appearance: alert, cooperative and no distress Resp: clear to auscultation bilaterally Chest wall: no tenderness, R chest port Cardio: regular rate and rhythm GI: normal findings: bowel sounds normal and soft, non-tender  Lab Results  Recent Labs  01/07/14 0459 01/08/14 0400  WBC 7.6 5.0  HGB 8.5* 7.7*  HCT 26.0* 23.1*  NA 135* 131*  K 3.5* 3.8  CL 100 99  CO2 21 19  BUN 19 14  CREATININE 0.65 0.67   Liver Panel No results for input(s): PROT, ALBUMIN, AST, ALT, ALKPHOS, BILITOT, BILIDIR, IBILI in the last 72 hours. Sedimentation Rate No results for input(s): ESRSEDRATE in the last 72 hours. C-Reactive Protein No results for input(s): CRP in the last 72 hours.  Microbiology:  Recent Results (from the past 240 hour(s))  Blood Culture (routine x 2)     Status: None (Preliminary result)   Collection Time: 01/05/14  4:00 PM  Result Value Ref Range Status   Specimen Description BLOOD PORTA CATH  Final   Special Requests BOTTLES DRAWN AEROBIC AND ANAEROBIC 5CC  Final   Culture  Setup Time   Final    01/05/2014 22:41 Performed at [facility]    Culture   Final           BLOOD CULTURE RECEIVED NO GROWTH TO DATE CULTURE WILL BE HELD FOR 5 DAYS BEFORE ISSUING A FINAL NEGATIVE REPORT Performed at [facility]    Report Status PENDING  Incomplete  Blood Culture (routine x 2)     Status: None (Preliminary result)   Collection Time: 01/05/14  4:12 PM  Result Value Ref Range Status   Specimen Description BLOOD ARM RIGHT   Final   Special Requests BOTTLES DRAWN AEROBIC AND ANAEROBIC 5CC  Final   Culture  Setup Time   Final    01/05/2014 22:42 Performed at [facility]    Culture   Final           BLOOD CULTURE RECEIVED NO GROWTH TO DATE CULTURE WILL BE HELD FOR 5 DAYS BEFORE ISSUING A FINAL NEGATIVE REPORT Performed at [facility]    Report Status PENDING  Incomplete  Urine culture     Status: None   Collection Time: 01/05/14  5:13 PM  Result Value Ref Range Status   Specimen Description URINE, CATHETERIZED  Final   Special Requests NONE  Final   Culture  Setup Time   Final    01/05/2014 17:45 Performed at [place] Performed at [facility]   Final   Culture NO GROWTH Performed at [facility]   Final   Report Status 01/06/2014 FINAL  Final  Culture, blood (routine x 2)     Status: None (Preliminary result)   Collection Time: 01/07/14  2:56 PM  Result Value Ref Range Status   Specimen Description BLOOD LEFT HAND  Final   Special Requests BOTTLES DRAWN AEROBIC AND ANAEROBIC 5 CC  Final   Culture  Setup Time   Final    01/07/2014 20:59 Performed at [facility]    Culture   Final           BLOOD CULTURE RECEIVED NO GROWTH TO DATE CULTURE WILL BE HELD FOR 5 DAYS BEFORE ISSUING A FINAL NEGATIVE REPORT Performed at [facility]    Report Status PENDING  Incomplete  Culture, blood (routine x 2)     Status: None (Preliminary result)   Collection Time: 01/07/14  3:01 PM  Result Value Ref Range Status   Specimen Description BLOOD RIGHT ARM  Final   Special Requests BOTTLES DRAWN AEROBIC AND ANAEROBIC 5 CC  Final   Culture  Setup Time   Final    01/07/2014 20:59 Performed at [facility]    Culture   Final           BLOOD CULTURE RECEIVED NO GROWTH TO DATE CULTURE WILL BE HELD FOR 5 DAYS BEFORE ISSUING A FINAL NEGATIVE REPORT Performed at [facility]    Report Status PENDING  Incomplete     Studies/Results: No results found.   Assessment/Plan: B cell Lymphoma HCAP, ground glass infiltrates DM AIDS (CD4 70)  Total days  of antibiotics: 4 vanco/cefepime Day 2 bactrim/prednisone  continue her current meds for now. Suspect that this is PCP related to her low CD4.  Watch her SPO2.  Check HIV genotype, INI, HLA, STI testing, hepatitis, pneumovax, flu vax         Bobby Rumpf Infectious Diseases (pager) [phone] [email or web address] 01/08/2014, 5:15 PM  LOS: 3 days

## 2014-01-09 DIAGNOSIS — B20 Human immunodeficiency virus [HIV] disease: Secondary | ICD-10-CM | POA: Insufficient documentation

## 2014-01-09 LAB — BASIC METABOLIC PANEL
Anion gap: 16 — ABNORMAL HIGH (ref 5–15)
BUN: 13 mg/dL (ref 6–23)
CALCIUM: 9.6 mg/dL (ref 8.4–10.5)
CHLORIDE: 100 meq/L (ref 96–112)
CO2: 19 meq/L (ref 19–32)
CREATININE: 0.75 mg/dL (ref 0.50–1.10)
GFR calc non Af Amer: >90 mL/min (ref >90)
Glucose, Bld: 178 mg/dL — ABNORMAL HIGH (ref 70–99)
Potassium: 4.8 mEq/L (ref 3.7–5.3)
SODIUM: 135 meq/L — AB (ref 137–147)

## 2014-01-09 LAB — HEPATITIS PANEL, ACUTE
HCV Ab: NEGATIVE
HEP A IGM: NONREACTIVE
HEP B S AG: NEGATIVE
Hep B C IgM: NONREACTIVE

## 2014-01-09 LAB — CBC
HCT: 27.5 % — ABNORMAL LOW (ref 36.0–46.0)
Hemoglobin: 9.2 g/dL — ABNORMAL LOW (ref 12.0–15.0)
MCH: 29.2 pg (ref 26.0–34.0)
MCHC: 33.5 g/dL (ref 30.0–36.0)
MCV: 87.3 fL (ref 78.0–100.0)
Platelets: 100 10*3/uL — ABNORMAL LOW (ref 150–400)
RBC: 3.15 MIL/uL — ABNORMAL LOW (ref 3.87–5.11)
RDW: 15.9 % — ABNORMAL HIGH (ref 11.5–15.5)
WBC: 3.3 10*3/uL — ABNORMAL LOW (ref 4.0–10.5)

## 2014-01-09 LAB — RPR

## 2014-01-09 LAB — LEGIONELLA ANTIGEN, URINE

## 2014-01-09 LAB — GLUCOSE, CAPILLARY
GLUCOSE-CAPILLARY: 357 mg/dL — AB (ref 70–99)
GLUCOSE-CAPILLARY: 410 mg/dL — AB (ref 70–99)
Glucose-Capillary: 230 mg/dL — ABNORMAL HIGH (ref 70–99)
Glucose-Capillary: 154 mg/dL — ABNORMAL HIGH (ref 70–99)
Glucose-Capillary: 379 mg/dL — ABNORMAL HIGH (ref 70–99)

## 2014-01-09 LAB — HEMOGLOBIN A1C
Hgb A1c MFr Bld: 7.5 % — ABNORMAL HIGH (ref <5.7)
MEAN PLASMA GLUCOSE: 169 mg/dL — AB (ref <117)

## 2014-01-09 LAB — HIV-1 RNA QUANT-NO REFLEX-BLD
HIV 1 RNA Quant: 500306 copies/mL — ABNORMAL HIGH (ref <20)
HIV-1 RNA Quant, Log: 5.7 {Log} — ABNORMAL HIGH (ref <1.30)

## 2014-01-09 MED ORDER — LEVOFLOXACIN 500 MG PO TABS
500.0000 mg | ORAL_TABLET | Freq: Every day | ORAL | Status: DC
  Administered 2014-01-09 – 2014-01-11 (×3): 500 mg via ORAL
  Filled 2014-01-09 (×4): qty 1

## 2014-01-09 MED ORDER — LEVALBUTEROL HCL 0.63 MG/3ML IN NEBU
0.6300 mg | INHALATION_SOLUTION | Freq: Three times a day (TID) | RESPIRATORY_TRACT | Status: DC
  Administered 2014-01-09 – 2014-01-10 (×3): 0.63 mg via RESPIRATORY_TRACT
  Filled 2014-01-09 (×6): qty 3

## 2014-01-09 MED ORDER — SENNA 8.6 MG PO TABS
1.0000 | ORAL_TABLET | Freq: Every day | ORAL | Status: DC | PRN
  Filled 2014-01-09: qty 1

## 2014-01-09 MED ORDER — PREDNISONE 20 MG PO TABS: 20.0000 mg | ORAL_TABLET | Freq: Every day | ORAL | Status: DC

## 2014-01-09 MED ORDER — PREDNISONE 20 MG PO TABS
40.0000 mg | ORAL_TABLET | Freq: Two times a day (BID) | ORAL | Status: DC
  Administered 2014-01-09 – 2014-01-11 (×5): 40 mg via ORAL
  Filled 2014-01-09 (×6): qty 2

## 2014-01-09 MED ORDER — SODIUM CHLORIDE 0.9 % IJ SOLN
10.0000 mL | INTRAMUSCULAR | Status: DC | PRN
  Administered 2014-01-09 – 2014-01-11 (×4): 10 mL
  Administered 2014-01-11: 20 mL
  Filled 2014-01-09 (×4): qty 40

## 2014-01-09 MED ORDER — SODIUM CHLORIDE 0.9 % IV BOLUS (SEPSIS)
500.0000 mL | Freq: Once | INTRAVENOUS | Status: AC
  Administered 2014-01-09: 500 mL via INTRAVENOUS

## 2014-01-09 MED ORDER — IPRATROPIUM BROMIDE 0.02 % IN SOLN
0.5000 mg | Freq: Three times a day (TID) | RESPIRATORY_TRACT | Status: DC
  Administered 2014-01-09 – 2014-01-10 (×3): 0.5 mg via RESPIRATORY_TRACT
  Filled 2014-01-09 (×3): qty 2.5

## 2014-01-09 MED ORDER — PREDNISONE 20 MG PO TABS: 40.0000 mg | ORAL_TABLET | Freq: Every day | ORAL | Status: DC

## 2014-01-09 NOTE — Progress Notes (Addendum)
[facility] PROGRESS NOTE  Bailey Moss FMB:[id number] DOB: Aug 11, 1955 DOA: 01/05/2014 PCP: Honor Junes, MD  Assessment/Plan:  HCAP -Continues to be afebrile, no leukocytosis, on 1L O2 [place].  Pt w/ recent hospitalization for COPD exacerbation, discharged on Levaquin -CT with evidence of pneumonia- ground glass opacities bilaterall IV Bactrim (day 3) and steroids, with suspicion of PCP after evidence of low CD4 count.y. -Placed on cefepime and vancomycin ( Day 4).  Patient placed on -ID on board, follow rec's- continue current abx regimen, will obtain more testings. -Continue supportive management with bronchodilators, mucolytics, antitussives, and oxygen as needed  HIVAIDS -Screening is positive for HIV, with CD4 count of 70 -Previous HCV screening negative in 2014, pt follows [place] clinic for lymphoma, however, no recent HIV screening. -ID on board- continue abx coverage for PCP.  -PRR/hepatits panel negative. Sputum culture, Viral load, HIV genotype, INI, HLA, and STI pending  Sepsis secondary to pneumonia -patient remains afebrile, no leukocytosis, other VSS.  Presented with fever, tachycardia, and pneumonia on CT - blood/urine cxs with no growth to date.  B Cell Lymphoma Patient currently on chemotherapy, next treatment 12/14 in [place]  Normocytic Anemia Hemoglobin 9.2-denies any current/hx of bleed -s/p 1 U PRBC transfusion  Diabetes mellitus -CBGs better controlled in 100s -Adjusted Levemir  To 10 units, continue SSI -Will check Hgb A1c  HTN -Continue Lopressor 12.23m BID  Constipation - Continue Miralax and [place] PRN  DVT Prophylaxis SQ Heparin Code Status: Full Family Communication: Daughter at bedside Disposition Plan: Home when stable   Consultants:  Infectious disease  Procedures:  None  Antibiotics:  IV Vancomycin 01/05/14 >>>  IV Cefepime 01/07/14 >>>  IV Bactrim 01/07/14 >>>  HPI/Subjective:  Bailey Doxtateris a  58yo female with PMH of B-cell lymphoma ( on chemotherapy), hypertension, diabetes mellitus, asthma/ COPD ( recent admission 11 days ago for COPD exacerbation), previous stroke (deficits of difficulty with memory, generalized weakness), and arthritis that presented to the ED with  Worsening fever, fatigue, and non productive cough for the past 3 days. She denies any shortness of breath, chest pain, abdominal pain.in the ED, patient was found to be septic, and CT chest with evidence of ground glass opacities suggestive of infection. Patient is admitted for further management.   Complains of constipation. States breathing much better, with less coughing.    Objective: Filed Vitals:   01/09/14 1006  BP: 100/72  Pulse: 84  Temp:   Resp:     Intake/Output Summary (Last 24 hours) at 01/09/14 1305 Last data filed at 01/09/14 07017 Gross per 24 hour  Intake   4205 ml  Output    500 ml  Net   3705 ml   Filed Weights   01/05/14 2228  Weight: 66.9 kg (147 lb 7.8 oz)    Exam:  Gen: Alert and oriented, in no acute distress  HEENT: Normocephalic, atraumatic.  Pupils symmertrical.  Moist mucosa.   Chest: clear to auscultate bilaterally, no ronchi or rales  Cardiac: Regular rate and rhythm, S1-S2, no rubs murmurs or gallops  Abdomen: soft, non tender, non distended, +bowel sounds. No guarding or rigidity  Extremities: Symmetrical in appearance without cyanosis or edema  Neurological: Alert awake oriented to time place and person.  Psychiatric: Appears normal.   Data Reviewed: Basic Metabolic Panel:  Recent Labs Lab 01/05/14 1541 01/07/14 0459 01/08/14 0400 01/09/14 0515  NA 128* 135* 131* 135*  K 4.4 3.5* 3.8 4.8  CL 91* 100 99 100  CO2 20 21 19 19   GLUCOSE 222* 79 188* 178*  BUN 20 19 14 13   CREATININE 0.96 0.65 0.67 0.75  CALCIUM 8.9 8.3* 8.4 9.6   Liver Function Tests:  Recent Labs Lab 01/05/14 1541  AST 50*  ALT 41*  ALKPHOS 103  BILITOT 0.5  PROT 6.8  ALBUMIN  2.6*   No results for input(s): LIPASE, AMYLASE in the last 168 hours. No results for input(s): AMMONIA in the last 168 hours. CBC:  Recent Labs Lab 01/05/14 1541 01/07/14 0459 01/08/14 0400 01/09/14 0515  WBC 8.0 7.6 5.0 3.3*  NEUTROABS 5.4  --   --   --   HGB 9.6* 8.5* 7.7* 9.2*  HCT 28.1* 26.0* 23.1* 27.5*  MCV 90.4 90.0 89.5 87.3  PLT 96* 103* 92* 100*   Cardiac Enzymes:  Recent Labs Lab 01/05/14 1541  TROPONINI <0.30   BNP (last 3 results)  Recent Labs  12/22/13 2321  PROBNP 76.3   CBG:  Recent Labs Lab 01/08/14 1149 01/08/14 1710 01/08/14 2038 01/09/14 0752 01/09/14 1140  GLUCAP 232* 260* 385* 230* 154*    Recent Results (from the past 240 hour(s))  Blood Culture (routine x 2)     Status: None (Preliminary result)   Collection Time: 01/05/14  4:00 PM  Result Value Ref Range Status   Specimen Description BLOOD PORTA CATH  Final   Special Requests BOTTLES DRAWN AEROBIC AND ANAEROBIC 5CC  Final   Culture  Setup Time   Final    01/05/2014 22:41 Performed at [facility]    Culture   Final           BLOOD CULTURE RECEIVED NO GROWTH TO DATE CULTURE WILL BE HELD FOR 5 DAYS BEFORE ISSUING A FINAL NEGATIVE REPORT Performed at [facility]    Report Status PENDING  Incomplete  Blood Culture (routine x 2)     Status: None (Preliminary result)   Collection Time: 01/05/14  4:12 PM  Result Value Ref Range Status   Specimen Description BLOOD ARM RIGHT  Final   Special Requests BOTTLES DRAWN AEROBIC AND ANAEROBIC 5CC  Final   Culture  Setup Time   Final    01/05/2014 22:42 Performed at [facility]    Culture   Final           BLOOD CULTURE RECEIVED NO GROWTH TO DATE CULTURE WILL BE HELD FOR 5 DAYS BEFORE ISSUING A FINAL NEGATIVE REPORT Performed at [facility]    Report Status PENDING  Incomplete  Urine culture     Status: None   Collection Time: 01/05/14  5:13 PM  Result Value Ref Range Status   Specimen Description  URINE, CATHETERIZED  Final   Special Requests NONE  Final   Culture  Setup Time   Final    01/05/2014 17:45 Performed at [place] Performed at [facility]   Final   Culture NO GROWTH Performed at [facility]   Final   Report Status 01/06/2014 FINAL  Final  Culture, blood (routine x 2)     Status: None (Preliminary result)   Collection Time: 01/07/14  2:56 PM  Result Value Ref Range Status   Specimen Description BLOOD LEFT HAND  Final   Special Requests BOTTLES DRAWN AEROBIC AND ANAEROBIC 5 CC  Final   Culture  Setup Time   Final    01/07/2014 20:59 Performed at [facility]  Culture   Final           BLOOD CULTURE RECEIVED NO GROWTH TO DATE CULTURE WILL BE HELD FOR 5 DAYS BEFORE ISSUING A FINAL NEGATIVE REPORT Performed at [facility]    Report Status PENDING  Incomplete  Culture, blood (routine x 2)     Status: None (Preliminary result)   Collection Time: 01/07/14  3:01 PM  Result Value Ref Range Status   Specimen Description BLOOD RIGHT ARM  Final   Special Requests BOTTLES DRAWN AEROBIC AND ANAEROBIC 5 CC  Final   Culture  Setup Time   Final    01/07/2014 20:59 Performed at [facility]    Culture   Final           BLOOD CULTURE RECEIVED NO GROWTH TO DATE CULTURE WILL BE HELD FOR 5 DAYS BEFORE ISSUING A FINAL NEGATIVE REPORT Performed at [facility]    Report Status PENDING  Incomplete     Studies: No results found.  Scheduled Meds: . sodium chloride   Intravenous Once  . antiseptic oral rinse  7 mL Mouth Rinse q12n4p  . ceFEPime (MAXIPIME) IV  1 g Intravenous 3 times per day  . folic acid  1 mg Oral Daily  . guaiFENesin  1,200 mg Oral BID  . heparin  5,000 Units Subcutaneous 3 times per day  . insulin aspart  0-15 Units Subcutaneous TID WC  . insulin detemir  10 Units Subcutaneous QPM  . ipratropium  0.5 mg Nebulization TID  . lactose free nutrition  237 mL Oral Q24H   . levalbuterol  0.63 mg Nebulization TID  . metoprolol tartrate  12.5 mg Oral BID  . mometasone-formoterol  2 puff Inhalation BID  . polyethylene glycol  17 g Oral Daily  . predniSONE  40 mg Oral BID WC   Followed by  . [START ON 01/14/2014] predniSONE  40 mg Oral Q breakfast   Followed by  . [START ON 01/19/2014] predniSONE  20 mg Oral Q breakfast  . sulfamethoxazole-trimethoprim  320 mg Intravenous Q8H  . vancomycin  750 mg Intravenous Q8H   Continuous Infusions: . sodium chloride 800 mL (01/07/14 1332)    Principal Problem:   HCAP (healthcare-associated pneumonia) Active Problems:   DM (diabetes mellitus)   H/O: CVA (cerebrovascular accident)   Rheumatoid arthritis   Lymphoma   HIV disease   Anemia   PCP (pneumocystis carinii pneumonia)    Time spent: [place], [place] [facility]  [facility] Pager [phone]. If 7PM-7AM, please contact night-coverage at [email or web address], password [facility] 01/09/2014, 1:05 PM  LOS: 4 days             Addendum  Patient seen and examined, chart and data base reviewed.  I agree with the above assessment and plan.  For full details please see Mrs. Lacy Duverney, PA-C note.  HIV/AIDS, diffuse B-cell lymphoma came in with respiratory illness.  Bilateral pneumonia cannot rule out PCP, patient is on cefepime, vancomycin and Bactrim/prednisone.  Ambulate and wean off of oxygen, likely can be discharge in a.m.   Birdie Hopes, MD [facility] Pager: [phone] 01/09/2014, 2:57 PM

## 2014-01-09 NOTE — Progress Notes (Signed)
Inpatient Diabetes Program Recommendations  AACE/ADA: New Consensus Statement on Inpatient Glycemic Control (2013)  Target Ranges:  Prepandial:   less than 140 mg/dL      Peak postprandial:   less than 180 mg/dL (1-2 hours)      Critically ill patients:  140 - 180 mg/dL   Fastings consistenly on low side-typically due to too much basal the day or HS prior.  Inpatient Diabetes Program Recommendations Insulin - Basal: Please consider a decrease in levemir basal insulin to 5 units at HS. (try to prevent low fastings) Pt on Moderate correction tidwc-recommend decrease to sensitive tidwc and HS scale as needed. And Add Meal coverage of 3 units tidwc (to start) while on any dose prednisone greater than 10 mg/day  Thank you, Rosita Kea, RN, CNS, Diabetes Coordinator [phone])

## 2014-01-09 NOTE — Progress Notes (Addendum)
INFECTIOUS DISEASE PROGRESS NOTE  ID: Bailey Moss is a 58 y.o. female with  Principal Problem:   HCAP (healthcare-associated pneumonia) Active Problems:   DM (diabetes mellitus)   H/O: CVA (cerebrovascular accident)   Rheumatoid arthritis   Lymphoma   HIV disease   Anemia   PCP (pneumocystis carinii pneumonia)  Subjective: Without complaints, no sob or cough. No problems with anbx  Abtx:  Anti-infectives    Start     Dose/Rate Route Frequency Ordered Stop   01/07/14 1843  vancomycin (VANCOCIN) IVPB 750 mg/150 ml premix     750 mg150 mL/hr over 60 Minutes Intravenous Every 8 hours 01/07/14 1153     01/07/14 1700  sulfamethoxazole-trimethoprim (BACTRIM) 320 mg in dextrose 5 % 500 mL IVPB     320 mg346.7 mL/hr over 90 Minutes Intravenous Every 8 hours 01/07/14 1546     01/05/14 2200  ceFEPIme (MAXIPIME) 1 g in dextrose 5 % 50 mL IVPB     1 g100 mL/hr over 30 Minutes Intravenous 3 times per day 01/05/14 2109 01/13/14 2159   01/05/14 2200  vancomycin (VANCOCIN) IVPB 750 mg/150 ml premix  Status:  Discontinued     750 mg150 mL/hr over 60 Minutes Intravenous Every 12 hours 01/05/14 2154 01/07/14 1153   01/05/14 2115  ceFEPIme (MAXIPIME) 1 g in dextrose 5 % 50 mL IVPB  Status:  Discontinued     1 g100 mL/hr over 30 Minutes Intravenous  Once 01/05/14 2108 01/05/14 2110      Medications:  Scheduled: . sodium chloride   Intravenous Once  . antiseptic oral rinse  7 mL Mouth Rinse q12n4p  . ceFEPime (MAXIPIME) IV  1 g Intravenous 3 times per day  . folic acid  1 mg Oral Daily  . guaiFENesin  1,200 mg Oral BID  . heparin  5,000 Units Subcutaneous 3 times per day  . insulin aspart  0-15 Units Subcutaneous TID WC  . insulin detemir  10 Units Subcutaneous QPM  . ipratropium  0.5 mg Nebulization TID  . lactose free nutrition  237 mL Oral Q24H  . levalbuterol  0.63 mg Nebulization TID  . metoprolol tartrate  12.5 mg Oral BID  . mometasone-formoterol  2 puff Inhalation BID  .  polyethylene glycol  17 g Oral Daily  . predniSONE  40 mg Oral BID WC   Followed by  . [START ON 01/14/2014] predniSONE  40 mg Oral Q breakfast   Followed by  . [START ON 01/19/2014] predniSONE  20 mg Oral Q breakfast  . sulfamethoxazole-trimethoprim  320 mg Intravenous Q8H  . vancomycin  750 mg Intravenous Q8H    Objective: Vital signs in last 24 hours: Temp:  [96.6 F (35.9 C)-97.8 F (36.6 C)] 96.6 F (35.9 C) (12/15 1504) Pulse Rate:  [80-96] 84 (12/15 1006) Resp:  [16-22] 18 (12/15 1504) BP: (84-119)/(58-72) 84/58 mmHg (12/15 1504) SpO2:  [99 %-100 %] 100 % (12/15 1504)   General appearance: alert, cooperative and no distress Resp: diminished breath sounds bilaterally Cardio: regular rate and rhythm GI: normal findings: bowel sounds normal and soft, non-tender  Lab Results  Recent Labs  01/08/14 0400 01/09/14 0515  WBC 5.0 3.3*  HGB 7.7* 9.2*  HCT 23.1* 27.5*  NA 131* 135*  K 3.8 4.8  CL 99 100  CO2 19 19  BUN 14 13  CREATININE 0.67 0.75   Liver Panel No results for input(s): PROT, ALBUMIN, AST, ALT, ALKPHOS, BILITOT, BILIDIR, IBILI in the last 72 hours.  Sedimentation Rate No results for input(s): ESRSEDRATE in the last 72 hours. C-Reactive Protein No results for input(s): CRP in the last 72 hours.  Microbiology: Recent Results (from the past 240 hour(s))  Blood Culture (routine x 2)     Status: None (Preliminary result)   Collection Time: 01/05/14  4:00 PM  Result Value Ref Range Status   Specimen Description BLOOD PORTA CATH  Final   Special Requests BOTTLES DRAWN AEROBIC AND ANAEROBIC 5CC  Final   Culture  Setup Time   Final    01/05/2014 22:41 Performed at [facility]    Culture   Final           BLOOD CULTURE RECEIVED NO GROWTH TO DATE CULTURE WILL BE HELD FOR 5 DAYS BEFORE ISSUING A FINAL NEGATIVE REPORT Performed at [facility]    Report Status PENDING  Incomplete  Blood Culture (routine x 2)     Status: None (Preliminary  result)   Collection Time: 01/05/14  4:12 PM  Result Value Ref Range Status   Specimen Description BLOOD ARM RIGHT  Final   Special Requests BOTTLES DRAWN AEROBIC AND ANAEROBIC 5CC  Final   Culture  Setup Time   Final    01/05/2014 22:42 Performed at [facility]    Culture   Final           BLOOD CULTURE RECEIVED NO GROWTH TO DATE CULTURE WILL BE HELD FOR 5 DAYS BEFORE ISSUING A FINAL NEGATIVE REPORT Performed at [facility]    Report Status PENDING  Incomplete  Urine culture     Status: None   Collection Time: 01/05/14  5:13 PM  Result Value Ref Range Status   Specimen Description URINE, CATHETERIZED  Final   Special Requests NONE  Final   Culture  Setup Time   Final    01/05/2014 17:45 Performed at [place] Performed at [facility]   Final   Culture NO GROWTH Performed at [facility]   Final   Report Status 01/06/2014 FINAL  Final  Culture, blood (routine x 2)     Status: None (Preliminary result)   Collection Time: 01/07/14  2:56 PM  Result Value Ref Range Status   Specimen Description BLOOD LEFT HAND  Final   Special Requests BOTTLES DRAWN AEROBIC AND ANAEROBIC 5 CC  Final   Culture  Setup Time   Final    01/07/2014 20:59 Performed at [facility]    Culture   Final           BLOOD CULTURE RECEIVED NO GROWTH TO DATE CULTURE WILL BE HELD FOR 5 DAYS BEFORE ISSUING A FINAL NEGATIVE REPORT Performed at [facility]    Report Status PENDING  Incomplete  Culture, blood (routine x 2)     Status: None (Preliminary result)   Collection Time: 01/07/14  3:01 PM  Result Value Ref Range Status   Specimen Description BLOOD RIGHT ARM  Final   Special Requests BOTTLES DRAWN AEROBIC AND ANAEROBIC 5 CC  Final   Culture  Setup Time   Final    01/07/2014 20:59 Performed at [facility]    Culture   Final           BLOOD CULTURE RECEIVED NO GROWTH TO DATE CULTURE WILL BE HELD FOR 5  DAYS BEFORE ISSUING A FINAL NEGATIVE REPORT Performed at [facility]    Report Status PENDING  Incomplete    Studies/Results: No results found.   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  Will d/c vanco/cefepime. Change to levaquin for legionella.  Appreciate pharmacy assistance with prednisone dosing.         Bobby Rumpf Infectious Diseases (pager) [phone] [email or web address] 01/09/2014, 5:06 PM  LOS: 4 days

## 2014-01-09 NOTE — Progress Notes (Signed)
Pt CBG 410. Baltazar Najjar, Np paged. Scheduled 10 units levemir given. Will continue to monitor pt.

## 2014-01-10 ENCOUNTER — Telehealth (HOSPITAL_BASED_OUTPATIENT_CLINIC_OR_DEPARTMENT_OTHER): Payer: Self-pay | Admitting: Emergency Medicine

## 2014-01-10 DIAGNOSIS — B59 Pneumocystosis: Secondary | ICD-10-CM

## 2014-01-10 DIAGNOSIS — E08319 Diabetes mellitus due to underlying condition with unspecified diabetic retinopathy without macular edema: Secondary | ICD-10-CM

## 2014-01-10 LAB — GLUCOSE, CAPILLARY
GLUCOSE-CAPILLARY: 397 mg/dL — AB (ref 70–99)
Glucose-Capillary: 232 mg/dL — ABNORMAL HIGH (ref 70–99)
Glucose-Capillary: 238 mg/dL — ABNORMAL HIGH (ref 70–99)
Glucose-Capillary: 280 mg/dL — ABNORMAL HIGH (ref 70–99)

## 2014-01-10 LAB — TYPE AND SCREEN
ABO/RH(D): B POS
Antibody Screen: NEGATIVE
Unit division: 0

## 2014-01-10 MED ORDER — LEVALBUTEROL HCL 0.63 MG/3ML IN NEBU
0.6300 mg | INHALATION_SOLUTION | Freq: Two times a day (BID) | RESPIRATORY_TRACT | Status: DC
  Administered 2014-01-11: 0.63 mg via RESPIRATORY_TRACT
  Filled 2014-01-10 (×3): qty 3

## 2014-01-10 MED ORDER — INSULIN DETEMIR 100 UNIT/ML ~~LOC~~ SOLN
14.0000 [IU] | Freq: Every evening | SUBCUTANEOUS | Status: DC
  Administered 2014-01-10 – 2014-01-11 (×2): 14 [IU] via SUBCUTANEOUS
  Filled 2014-01-10 (×3): qty 0.14

## 2014-01-10 MED ORDER — SULFAMETHOXAZOLE-TRIMETHOPRIM 800-160 MG PO TABS
2.0000 | ORAL_TABLET | Freq: Three times a day (TID) | ORAL | Status: DC
  Administered 2014-01-10 – 2014-01-11 (×3): 2 via ORAL
  Filled 2014-01-10 (×6): qty 2

## 2014-01-10 MED ORDER — IPRATROPIUM BROMIDE 0.02 % IN SOLN
0.5000 mg | Freq: Two times a day (BID) | RESPIRATORY_TRACT | Status: DC
  Administered 2014-01-11: 0.5 mg via RESPIRATORY_TRACT
  Filled 2014-01-10: qty 2.5

## 2014-01-10 NOTE — Progress Notes (Signed)
Bailey Moss for Infectious Disease  Date of Admission:  01/05/2014  Antibiotics: levaquin day 2 Bactrim day 3  Subjective: Feels fine, no sob  Objective: Temp:  [96.6 F (35.9 C)-98.5 F (36.9 C)] 98.2 F (36.8 C) (12/16 1030) Pulse Rate:  [73-90] 80 (12/16 1030) Resp:  [2-20] 20 (12/16 1030) BP: (84-112)/(51-67) 112/65 mmHg (12/16 1030) SpO2:  [98 %-100 %] 100 % (12/16 1030)  General: awake, alert, nad Skin: no rashes Lungs: diminished but no wheezing noted Cor: rrr Abdomen: soft, nt Ext: no edema  Lab Results Lab Results  Component Value Date   WBC 3.3* 01/09/2014   HGB 9.2* 01/09/2014   HCT 27.5* 01/09/2014   MCV 87.3 01/09/2014   PLT 100* 01/09/2014    Lab Results  Component Value Date   CREATININE 0.75 01/09/2014   BUN 13 01/09/2014   NA 135* 01/09/2014   K 4.8 01/09/2014   CL 100 01/09/2014   CO2 19 01/09/2014    Lab Results  Component Value Date   ALT 41* 01/05/2014   AST 50* 01/05/2014   ALKPHOS 103 01/05/2014   BILITOT 0.5 01/05/2014      Microbiology: Recent Results (from the past 240 hour(s))  Blood Culture (routine x 2)     Status: None (Preliminary result)   Collection Time: 01/05/14  4:00 PM  Result Value Ref Range Status   Specimen Description BLOOD PORTA CATH  Final   Special Requests BOTTLES DRAWN AEROBIC AND ANAEROBIC 5CC  Final   Culture  Setup Time   Final    01/05/2014 22:41 Performed at [facility]    Culture   Final           BLOOD CULTURE RECEIVED NO GROWTH TO DATE CULTURE WILL BE HELD FOR 5 DAYS BEFORE ISSUING A FINAL NEGATIVE REPORT Performed at [facility]    Report Status PENDING  Incomplete  Blood Culture (routine x 2)     Status: None (Preliminary result)   Collection Time: 01/05/14  4:12 PM  Result Value Ref Range Status   Specimen Description BLOOD ARM RIGHT  Final   Special Requests BOTTLES DRAWN AEROBIC AND ANAEROBIC 5CC  Final   Culture  Setup Time   Final    01/05/2014  22:42 Performed at [facility]    Culture   Final           BLOOD CULTURE RECEIVED NO GROWTH TO DATE CULTURE WILL BE HELD FOR 5 DAYS BEFORE ISSUING A FINAL NEGATIVE REPORT Performed at [facility]    Report Status PENDING  Incomplete  Urine culture     Status: None   Collection Time: 01/05/14  5:13 PM  Result Value Ref Range Status   Specimen Description URINE, CATHETERIZED  Final   Special Requests NONE  Final   Culture  Setup Time   Final    01/05/2014 17:45 Performed at [place] Performed at [facility]   Final   Culture NO GROWTH Performed at [facility]   Final   Report Status 01/06/2014 FINAL  Final  Culture, blood (routine x 2)     Status: None (Preliminary result)   Collection Time: 01/07/14  2:56 PM  Result Value Ref Range Status   Specimen Description BLOOD LEFT HAND  Final   Special Requests BOTTLES DRAWN AEROBIC AND ANAEROBIC 5 CC  Final   Culture  Setup Time   Final  01/07/2014 20:59 Performed at [facility]    Culture   Final           BLOOD CULTURE RECEIVED NO GROWTH TO DATE CULTURE WILL BE HELD FOR 5 DAYS BEFORE ISSUING A FINAL NEGATIVE REPORT Performed at [facility]    Report Status PENDING  Incomplete  Culture, blood (routine x 2)     Status: None (Preliminary result)   Collection Time: 01/07/14  3:01 PM  Result Value Ref Range Status   Specimen Description BLOOD RIGHT ARM  Final   Special Requests BOTTLES DRAWN AEROBIC AND ANAEROBIC 5 CC  Final   Culture  Setup Time   Final    01/07/2014 20:59 Performed at [facility]    Culture   Final           BLOOD CULTURE RECEIVED NO GROWTH TO DATE CULTURE WILL BE HELD FOR 5 DAYS BEFORE ISSUING A FINAL NEGATIVE REPORT Performed at [facility]    Report Status PENDING  Incomplete    Studies/Results: No results found.  Assessment/Plan:  1) PCP pneumonia - will need 21 days of bactrim and  steroids  2) legionella - possible contributor - will need 10 days of levaquin 500 mg  3) HIV - confirmed.  Awaiting genotype to start ARVs.  Doing well with PCP and minimal O2 requirement.    Scharlene Gloss, [place] for Infectious Disease [place] [email or web address] [phone] pager   [phone] cell 01/10/2014, 11:08 AM

## 2014-01-10 NOTE — Progress Notes (Signed)
NP Baltazar Najjar notified concerning patient's cbg tonight. Awaiting any further orders

## 2014-01-10 NOTE — Progress Notes (Signed)
PROGRESS NOTE  Bailey Moss AYT:[id number] DOB: 09-Sep-1955 DOA: 01/05/2014 PCP: Honor Junes, MD  HPI: 58 y.o. female with B cell lymphoma, undergoing chemo with next session scheduled for Monday. Recent admission to [facility] hospital just discharged 11 days ago for Asthma / COPD exacerbation. Patient presents to [facility] with 3 day history of fever, worsening cough, fatigue. Fever is up to 102.1 at home. Treated with tylenol. Cough is dry and patient is SOB.  Subjective / 24 H Interval events - feeling better this morning, breathing better  Assessment/Plan: Principal Problem:   HCAP (healthcare-associated pneumonia) Active Problems:   DM (diabetes mellitus)   H/O: CVA (cerebrovascular accident)   Rheumatoid arthritis   Lymphoma   HIV disease   Anemia   PCP (pneumocystis carinii pneumonia)   HCAP - Continues to be afebrile, no leukocytosis, on 1L O2 [place]. Pt w/ recent hospitalization for COPD exacerbation, discharged on Levaquin - CT with evidence of pneumonia- ground glass opacities bilateral - ID following, currently on Legionella and bactrim  - HIV genotyping in process - Continue supportive management with bronchodilators, mucolytics, antitussives, and oxygen as needed - try to wean off oxygen   HIVAIDS - Screening is positive for HIV, with CD4 count of 70 - Previous HCV screening negative in 2014, pt follows [facility] for lymphoma, however, no recent HIV screening. - ID on board- continue abx coverage for PCP.  - RPR/hepatits panel negative. Sputum culture, Viral load, HIV genotype, INI, HLA, and STI pending  Sepsis secondary to pneumonia - patient remains afebrile, no leukocytosis, other VSS. Presented with fever, tachycardia, and pneumonia on CT - blood/urine cxs with no growth to date.  B Cell Lymphoma - Patient currently on chemotherapy, next treatment 12/14 in [facility]  Normocytic Anemia - Hb stable, repeat in am - s/p 1 U PRBC  transfusion  Diabetes mellitus - CBGs influenced by prednisone, increase Levemir to 15 today  - Hgb A1c 7.5  HTN -Continue Lopressor 12.5m BID  Constipation - Continue Miralax and [place] PRN   Diet: Diet Carb Modified Fluids: none  DVT Prophylaxis: heparin   Code Status: Full Code Family Communication: d/w daughter bedside  Disposition Plan: inpatient, home when ready   Consultants:  ID  Procedures:  None    Antibiotics  Anti-infectives    Start     Dose/Rate Route Frequency Ordered Stop   01/10/14 1400  sulfamethoxazole-trimethoprim (BACTRIM DS,SEPTRA DS) 800-160 MG per tablet 2 tablet    Comments:  Pharmacy may adjust   2 tablet Oral 3 times per day 01/10/14 1107     01/09/14 1800  levofloxacin (LEVAQUIN) tablet 500 mg     500 mg Oral Daily-1800 01/09/14 1724     01/07/14 1843  vancomycin (VANCOCIN) IVPB 750 mg/150 ml premix  Status:  Discontinued     750 mg150 mL/hr over 60 Minutes Intravenous Every 8 hours 01/07/14 1153 01/09/14 1709   01/07/14 1700  sulfamethoxazole-trimethoprim (BACTRIM) 320 mg in dextrose 5 % 500 mL IVPB  Status:  Discontinued     320 mg346.7 mL/hr over 90 Minutes Intravenous Every 8 hours 01/07/14 1546 01/10/14 1106   01/05/14 2200  ceFEPIme (MAXIPIME) 1 g in dextrose 5 % 50 mL IVPB  Status:  Discontinued     1 g100 mL/hr over 30 Minutes Intravenous 3 times per day 01/05/14 2109 01/09/14 1709   01/05/14 2200  vancomycin (VANCOCIN) IVPB 750 mg/150 ml premix  Status:  Discontinued     750 mg150 mL/hr over  60 Minutes Intravenous Every 12 hours 01/05/14 2154 01/07/14 1153   01/05/14 2115  ceFEPIme (MAXIPIME) 1 g in dextrose 5 % 50 mL IVPB  Status:  Discontinued     1 g100 mL/hr over 30 Minutes Intravenous  Once 01/05/14 2108 01/05/14 2110       Studies  No results found.   Objective  Filed Vitals:   01/10/14 1019 01/10/14 1030 01/10/14 1400 01/10/14 1540  BP:  112/65 110/63   Pulse:  80 80   Temp:  98.2 F (36.8 C) 97.7 F (36.5  C)   TempSrc:   Oral   Resp:  20 18   Height:      Weight:      SpO2: 98% 100% 100% 97%    Intake/Output Summary (Last 24 hours) at 01/10/14 1615 Last data filed at 01/10/14 1412  Gross per 24 hour  Intake    480 ml  Output   1050 ml  Net   -570 ml   Filed Weights   01/05/14 2228  Weight: 66.9 kg (147 lb 7.8 oz)    Exam:  General:  NAD  Cardiovascular: RRR  Respiratory: CTA biL  Abdomen: soft, non tender  MSK: no edema   Data Reviewed: Basic Metabolic Panel:  Recent Labs Lab 01/05/14 1541 01/07/14 0459 01/08/14 0400 01/09/14 0515  NA 128* 135* 131* 135*  K 4.4 3.5* 3.8 4.8  CL 91* 100 99 100  CO2 20 21 19 19   GLUCOSE 222* 79 188* 178*  BUN 20 19 14 13   CREATININE 0.96 0.65 0.67 0.75  CALCIUM 8.9 8.3* 8.4 9.6   Liver Function Tests:  Recent Labs Lab 01/05/14 1541  AST 50*  ALT 41*  ALKPHOS 103  BILITOT 0.5  PROT 6.8  ALBUMIN 2.6*   CBC:  Recent Labs Lab 01/05/14 1541 01/07/14 0459 01/08/14 0400 01/09/14 0515  WBC 8.0 7.6 5.0 3.3*  NEUTROABS 5.4  --   --   --   HGB 9.6* 8.5* 7.7* 9.2*  HCT 28.1* 26.0* 23.1* 27.5*  MCV 90.4 90.0 89.5 87.3  PLT 96* 103* 92* 100*   Cardiac Enzymes:  Recent Labs Lab 01/05/14 1541  TROPONINI <0.30   BNP (last 3 results)  Recent Labs  12/22/13 2321  PROBNP 76.3   CBG:  Recent Labs Lab 01/09/14 1653 01/09/14 1954 01/09/14 2311 01/10/14 0801 01/10/14 1150  GLUCAP 357* 410* 379* 232* 238*    Recent Results (from the past 240 hour(s))  Blood Culture (routine x 2)     Status: None (Preliminary result)   Collection Time: 01/05/14  4:00 PM  Result Value Ref Range Status   Specimen Description BLOOD PORTA CATH  Final   Special Requests BOTTLES DRAWN AEROBIC AND ANAEROBIC 5CC  Final   Culture  Setup Time   Final    01/05/2014 22:41 Performed at [facility]    Culture   Final           BLOOD CULTURE RECEIVED NO GROWTH TO DATE CULTURE WILL BE HELD FOR 5 DAYS BEFORE ISSUING A  FINAL NEGATIVE REPORT Performed at [facility]    Report Status PENDING  Incomplete  Blood Culture (routine x 2)     Status: None (Preliminary result)   Collection Time: 01/05/14  4:12 PM  Result Value Ref Range Status   Specimen Description BLOOD ARM RIGHT  Final   Special Requests BOTTLES DRAWN AEROBIC AND ANAEROBIC 5CC  Final   Culture  Setup Time  Final    01/05/2014 22:42 Performed at [facility]    Culture   Final           BLOOD CULTURE RECEIVED NO GROWTH TO DATE CULTURE WILL BE HELD FOR 5 DAYS BEFORE ISSUING A FINAL NEGATIVE REPORT Performed at [facility]    Report Status PENDING  Incomplete  Urine culture     Status: None   Collection Time: 01/05/14  5:13 PM  Result Value Ref Range Status   Specimen Description URINE, CATHETERIZED  Final   Special Requests NONE  Final   Culture  Setup Time   Final    01/05/2014 17:45 Performed at [place] Performed at [facility]   Final   Culture NO GROWTH Performed at [facility]   Final   Report Status 01/06/2014 FINAL  Final  Culture, blood (routine x 2)     Status: None (Preliminary result)   Collection Time: 01/07/14  2:56 PM  Result Value Ref Range Status   Specimen Description BLOOD LEFT HAND  Final   Special Requests BOTTLES DRAWN AEROBIC AND ANAEROBIC 5 CC  Final   Culture  Setup Time   Final    01/07/2014 20:59 Performed at [facility]    Culture   Final           BLOOD CULTURE RECEIVED NO GROWTH TO DATE CULTURE WILL BE HELD FOR 5 DAYS BEFORE ISSUING A FINAL NEGATIVE REPORT Performed at [facility]    Report Status PENDING  Incomplete  Culture, blood (routine x 2)     Status: None (Preliminary result)   Collection Time: 01/07/14  3:01 PM  Result Value Ref Range Status   Specimen Description BLOOD RIGHT ARM  Final   Special Requests BOTTLES DRAWN AEROBIC AND ANAEROBIC 5 CC  Final   Culture  Setup Time   Final     01/07/2014 20:59 Performed at [facility]    Culture   Final           BLOOD CULTURE RECEIVED NO GROWTH TO DATE CULTURE WILL BE HELD FOR 5 DAYS BEFORE ISSUING A FINAL NEGATIVE REPORT Performed at [facility]    Report Status PENDING  Incomplete     Scheduled Meds: . sodium chloride   Intravenous Once  . antiseptic oral rinse  7 mL Mouth Rinse [id number]  . folic acid  1 mg Oral Daily  . guaiFENesin  1,200 mg Oral BID  . heparin  5,000 Units Subcutaneous 3 times per day  . insulin aspart  0-15 Units Subcutaneous TID WC  . insulin detemir  10 Units Subcutaneous QPM  . [START ON 01/11/2014] ipratropium  0.5 mg Nebulization BID  . lactose free nutrition  237 mL Oral Q24H  . [START ON 01/11/2014] levalbuterol  0.63 mg Nebulization BID  . levofloxacin  500 mg Oral q1800  . metoprolol tartrate  12.5 mg Oral BID  . mometasone-formoterol  2 puff Inhalation BID  . polyethylene glycol  17 g Oral Daily  . predniSONE  40 mg Oral BID WC   Followed by  . [START ON 01/14/2014] predniSONE  40 mg Oral Q breakfast   Followed by  . [START ON 01/19/2014] predniSONE  20 mg Oral Q breakfast  . sulfamethoxazole-trimethoprim  2 tablet Oral 3 times per day   Continuous Infusions: . sodium chloride 800 mL (01/07/14 1332)    Time spent: 35 minutes  Marzetta Board, MD [facility] Pager [phone]. If 7 PM - 7 AM, please contact night-coverage at [email or web address], password [facility] 01/10/2014, 4:15 PM  LOS: 5 days

## 2014-01-10 NOTE — Progress Notes (Signed)
ANTIBIOTIC CONSULT NOTE - FOLLOW UP  Pharmacy Consult for Trimethoprim/Sulfamethoxazole Indication: PCP pneumonia  Allergies  Allergen Reactions  . Aspirin Other (See Comments)    Blood sugar goes up    Patient Measurements: Height: 5\' 4"  (162.6 cm) Weight: 147 lb 7.8 oz (66.9 kg) IBW/kg (Calculated) : 54.7 Adjusted Body Weight:   Vital Signs: Temp: 98.2 F (36.8 C) (12/16 1030) Temp Source: Oral (12/16 0612) BP: 112/65 mmHg (12/16 1030) Pulse Rate: 80 (12/16 1030) Intake/Output from previous day: 12/15 0701 - 12/16 0700 In: 580 [P.O.:580] Out: 750 [Urine:750] Intake/Output from this shift: Total I/O In: -  Out: 300 [Urine:300]  Labs:  Recent Labs  01/08/14 0400 01/09/14 0515  WBC 5.0 3.3*  HGB 7.7* 9.2*  PLT 92* 100*  CREATININE 0.67 0.75   Estimated Creatinine Clearance: 70.4 mL/min (by C-G formula based on Cr of 0.75). No results for input(s): VANCOTROUGH, VANCOPEAK, VANCORANDOM, GENTTROUGH, GENTPEAK, GENTRANDOM, TOBRATROUGH, TOBRAPEAK, TOBRARND, AMIKACINPEAK, AMIKACINTROU, AMIKACIN in the last 72 hours.   Microbiology: Recent Results (from the past 720 hour(s))  Blood culture (routine x 2)     Status: None   Collection Time: 12/23/13  6:25 AM  Result Value Ref Range Status   Specimen Description BLOOD LEFT ARM  Final   Special Requests BOTTLES DRAWN AEROBIC ONLY 10CC  Final   Culture  Setup Time   Final    12/23/2013 17:55 Performed at [facility]    Culture   Final    NO GROWTH 5 DAYS Note: Culture results may be compromised due to an excessive volume of blood received in culture bottles. Performed at [facility]    Report Status 12/29/2013 FINAL  Final  Blood culture (routine x 2)     Status: None   Collection Time: 12/23/13  6:30 AM  Result Value Ref Range Status   Specimen Description BLOOD LEFT HAND  Final   Special Requests BOTTLES DRAWN AEROBIC AND ANAEROBIC 10CC  Final   Culture  Setup Time   Final    12/23/2013  17:55 Performed at [facility]    Culture   Final    NO GROWTH 5 DAYS Note: Culture results may be compromised due to an excessive volume of blood received in culture bottles. Performed at [facility]    Report Status 12/29/2013 FINAL  Final  Blood Culture (routine x 2)     Status: None (Preliminary result)   Collection Time: 01/05/14  4:00 PM  Result Value Ref Range Status   Specimen Description BLOOD PORTA CATH  Final   Special Requests BOTTLES DRAWN AEROBIC AND ANAEROBIC 5CC  Final   Culture  Setup Time   Final    01/05/2014 22:41 Performed at [facility]    Culture   Final           BLOOD CULTURE RECEIVED NO GROWTH TO DATE CULTURE WILL BE HELD FOR 5 DAYS BEFORE ISSUING A FINAL NEGATIVE REPORT Performed at [facility]    Report Status PENDING  Incomplete  Blood Culture (routine x 2)     Status: None (Preliminary result)   Collection Time: 01/05/14  4:12 PM  Result Value Ref Range Status   Specimen Description BLOOD ARM RIGHT  Final   Special Requests BOTTLES DRAWN AEROBIC AND ANAEROBIC 5CC  Final   Culture  Setup Time   Final    01/05/2014 22:42 Performed at [facility]   Final  BLOOD CULTURE RECEIVED NO GROWTH TO DATE CULTURE WILL BE HELD FOR 5 DAYS BEFORE ISSUING A FINAL NEGATIVE REPORT Performed at [facility]    Report Status PENDING  Incomplete  Urine culture     Status: None   Collection Time: 01/05/14  5:13 PM  Result Value Ref Range Status   Specimen Description URINE, CATHETERIZED  Final   Special Requests NONE  Final   Culture  Setup Time   Final    01/05/2014 17:45 Performed at [place] Performed at [facility]   Final   Culture NO GROWTH Performed at [facility]   Final   Report Status 01/06/2014 FINAL  Final  Culture, blood (routine x 2)     Status: None (Preliminary result)   Collection Time: 01/07/14  2:56 PM  Result  Value Ref Range Status   Specimen Description BLOOD LEFT HAND  Final   Special Requests BOTTLES DRAWN AEROBIC AND ANAEROBIC 5 CC  Final   Culture  Setup Time   Final    01/07/2014 20:59 Performed at [facility]    Culture   Final           BLOOD CULTURE RECEIVED NO GROWTH TO DATE CULTURE WILL BE HELD FOR 5 DAYS BEFORE ISSUING A FINAL NEGATIVE REPORT Performed at [facility]    Report Status PENDING  Incomplete  Culture, blood (routine x 2)     Status: None (Preliminary result)   Collection Time: 01/07/14  3:01 PM  Result Value Ref Range Status   Specimen Description BLOOD RIGHT ARM  Final   Special Requests BOTTLES DRAWN AEROBIC AND ANAEROBIC 5 CC  Final   Culture  Setup Time   Final    01/07/2014 20:59 Performed at [facility]    Culture   Final           BLOOD CULTURE RECEIVED NO GROWTH TO DATE CULTURE WILL BE HELD FOR 5 DAYS BEFORE ISSUING A FINAL NEGATIVE REPORT Performed at [facility]    Report Status PENDING  Incomplete    Anti-infectives    Start     Dose/Rate Route Frequency Ordered Stop   01/10/14 1400  sulfamethoxazole-trimethoprim (BACTRIM DS,SEPTRA DS) 800-160 MG per tablet 2 tablet    Comments:  Pharmacy may adjust   2 tablet Oral 3 times per day 01/10/14 1107     01/09/14 1800  levofloxacin (LEVAQUIN) tablet 500 mg     500 mg Oral Daily-1800 01/09/14 1724     01/07/14 1843  vancomycin (VANCOCIN) IVPB 750 mg/150 ml premix  Status:  Discontinued     750 mg150 mL/hr over 60 Minutes Intravenous Every 8 hours 01/07/14 1153 01/09/14 1709   01/07/14 1700  sulfamethoxazole-trimethoprim (BACTRIM) 320 mg in dextrose 5 % 500 mL IVPB  Status:  Discontinued     320 mg346.7 mL/hr over 90 Minutes Intravenous Every 8 hours 01/07/14 1546 01/10/14 1106   01/05/14 2200  ceFEPIme (MAXIPIME) 1 g in dextrose 5 % 50 mL IVPB  Status:  Discontinued     1 g100 mL/hr over 30 Minutes Intravenous 3 times per day 01/05/14 2109 01/09/14 1709   01/05/14  2200  vancomycin (VANCOCIN) IVPB 750 mg/150 ml premix  Status:  Discontinued     750 mg150 mL/hr over 60 Minutes Intravenous Every 12 hours 01/05/14 2154 01/07/14 1153   01/05/14 2115  ceFEPIme (MAXIPIME) 1 g in dextrose 5 % 50  mL IVPB  Status:  Discontinued     1 g100 mL/hr over 30 Minutes Intravenous  Once 01/05/14 2108 01/05/14 2110      Assessment: 58yo female with PCP pneumonia, newly confirmed (+)HIV and currently undergoing treatment for B-cell lymphoma.  Pt is afebrile, WBC 3.3.  Now receiving Septra PO.  Goal of Therapy:  treatment of infection  Plan:  Continue Septra 320mg  po q8 (~15mg /kg/day TMP)  Gracy Bruins, PharmD Clinical Pharmacist [facility]

## 2014-01-10 NOTE — Progress Notes (Signed)
Results for CAPUCINE, TRYON (MRN [id number]) as of 01/10/2014 12:41  Ref. Range 01/09/2014 16:53 01/09/2014 19:54 01/09/2014 23:11 01/10/2014 08:01 01/10/2014 11:50  Glucose-Capillary Latest Range: 70-99 mg/dL 357 (H) 410 (H) 379 (H) 232 (H) 238 (H)  CBGs continue to be greater than 180 mg/dl.  Recommend increasing Levemir to 15 units daily and continue Novolog correction scale TID. Will continue to follow while in hospital.  Harvel Ricks RN BSN CDE

## 2014-01-10 NOTE — Progress Notes (Signed)
Patient ambulated:  Saturation with o2: 100%  Saturation without o2: 94-98%  Patient walked about 150 feet down the hallway. Patient did very well without oxygen. Patient stated that she was fine and there was no complaints of shortness of breath.

## 2014-01-11 LAB — CULTURE, BLOOD (ROUTINE X 2)
Culture: NO GROWTH
Culture: NO GROWTH

## 2014-01-11 LAB — GLUCOSE, CAPILLARY
GLUCOSE-CAPILLARY: 342 mg/dL — AB (ref 70–99)
Glucose-Capillary: 167 mg/dL — ABNORMAL HIGH (ref 70–99)
Glucose-Capillary: 275 mg/dL — ABNORMAL HIGH (ref 70–99)

## 2014-01-11 LAB — COMPREHENSIVE METABOLIC PANEL
ALBUMIN: 2.9 g/dL — AB (ref 3.5–5.2)
ALK PHOS: 121 U/L — AB (ref 39–117)
ALT: 42 U/L — AB (ref 0–35)
AST: 31 U/L (ref 0–37)
Anion gap: 14 (ref 5–15)
BUN: 33 mg/dL — ABNORMAL HIGH (ref 6–23)
CHLORIDE: 99 meq/L (ref 96–112)
CO2: 19 meq/L (ref 19–32)
CREATININE: 0.97 mg/dL (ref 0.50–1.10)
Calcium: 10.2 mg/dL (ref 8.4–10.5)
GFR calc Af Amer: 72 mL/min — ABNORMAL LOW (ref >90)
GFR calc non Af Amer: 62 mL/min — ABNORMAL LOW (ref >90)
Glucose, Bld: 225 mg/dL — ABNORMAL HIGH (ref 70–99)
POTASSIUM: 6.2 meq/L — AB (ref 3.7–5.3)
Sodium: 132 mEq/L — ABNORMAL LOW (ref 137–147)
Total Protein: 7.3 g/dL (ref 6.0–8.3)

## 2014-01-11 LAB — CBC
HEMATOCRIT: 28.7 % — AB (ref 36.0–46.0)
Hemoglobin: 9.4 g/dL — ABNORMAL LOW (ref 12.0–15.0)
MCH: 28.8 pg (ref 26.0–34.0)
MCHC: 32.8 g/dL (ref 30.0–36.0)
MCV: 88 fL (ref 78.0–100.0)
PLATELETS: 114 10*3/uL — AB (ref 150–400)
RBC: 3.26 MIL/uL — ABNORMAL LOW (ref 3.87–5.11)
RDW: 16.3 % — ABNORMAL HIGH (ref 11.5–15.5)
WBC: 5.3 10*3/uL (ref 4.0–10.5)

## 2014-01-11 LAB — POTASSIUM: Potassium: 5 mEq/L (ref 3.7–5.3)

## 2014-01-11 MED ORDER — HEPARIN SOD (PORK) LOCK FLUSH 100 UNIT/ML IV SOLN
500.0000 [IU] | INTRAVENOUS | Status: AC | PRN
  Administered 2014-01-11: 500 [IU]

## 2014-01-11 MED ORDER — SODIUM POLYSTYRENE SULFONATE 15 GM/60ML PO SUSP
15.0000 g | Freq: Once | ORAL | Status: DC
  Filled 2014-01-11: qty 60

## 2014-01-11 MED ORDER — LEVOFLOXACIN 500 MG PO TABS: 500.0000 mg | ORAL_TABLET | Freq: Every day | ORAL | Status: DC

## 2014-01-11 MED ORDER — INSULIN DETEMIR 100 UNIT/ML FLEXPEN: 15.0000 [IU] | PEN_INJECTOR | Freq: Every evening | SUBCUTANEOUS | Status: AC

## 2014-01-11 MED ORDER — PRIMAQUINE PHOSPHATE 26.3 MG PO TABS: 30.0000 mg | ORAL_TABLET | Freq: Every day | ORAL | Status: DC

## 2014-01-11 MED ORDER — SODIUM POLYSTYRENE SULFONATE 15 GM/60ML PO SUSP
30.0000 g | Freq: Once | ORAL | Status: AC
  Administered 2014-01-11: 30 g via ORAL
  Filled 2014-01-11: qty 120

## 2014-01-11 MED ORDER — CLINDAMYCIN HCL 300 MG PO CAPS: 600.0000 mg | ORAL_CAPSULE | Freq: Three times a day (TID) | ORAL | Status: DC

## 2014-01-11 MED ORDER — CLINDAMYCIN HCL 300 MG PO CAPS
600.0000 mg | ORAL_CAPSULE | Freq: Three times a day (TID) | ORAL | Status: DC
  Administered 2014-01-11: 600 mg via ORAL
  Filled 2014-01-11 (×3): qty 2

## 2014-01-11 MED ORDER — METOPROLOL TARTRATE 25 MG PO TABS: 12.5000 mg | ORAL_TABLET | Freq: Two times a day (BID) | ORAL | Status: AC

## 2014-01-11 MED ORDER — PRIMAQUINE PHOSPHATE 26.3 MG PO TABS
30.0000 mg | ORAL_TABLET | Freq: Every day | ORAL | Status: DC
  Administered 2014-01-11: 30 mg via ORAL
  Filled 2014-01-11: qty 2

## 2014-01-11 MED ORDER — DELTASONE 20 MG PO TABS: 20.0000 mg | ORAL_TABLET | Freq: Every day | ORAL | Status: DC

## 2014-01-11 MED ORDER — PREDNISONE 20 MG PO TABS: 40.0000 mg | ORAL_TABLET | Freq: Two times a day (BID) | ORAL | Status: DC

## 2014-01-11 NOTE — Care Management Note (Signed)
    Page 1 of 1   01/11/2014     3:43:33 PM CARE MANAGEMENT NOTE 01/11/2014  Patient:  Bailey Moss, Bailey Moss   Account Number:  [id number]  Date Initiated:  01/11/2014  Documentation initiated by:  Tomi Bamberger  Subjective/Objective Assessment:   dx pna, 042  admit- lives with daughter, patient receives chemo in [place].     Action/Plan:   Anticipated DC Date:  01/11/2014   Anticipated DC Plan:  [place]  CM consult      [facility] Choice  HOME HEALTH   Choice offered to / List presented to:  C-1 Patient        [place] arranged  HH-1 RN  [place].   Status of service:  Completed, signed off Medicare Important Message given?  NO (If response is "NO", the following Medicare IM given date fields will be blank) Date Medicare IM given:   Medicare IM given by:   Date Additional Medicare IM given:   Additional Medicare IM given by:    Discharge Disposition:  [place]  Per UR Regulation:  Reviewed for med. necessity/level of care/duration of stay  If discussed at [place] of Stay Meetings, dates discussed:    Comments:  01/11/14 [place], BSN [phone] patient is for dc today, she will be staying with her daughter at [address], [place], [place] [address], phone [phone] and patient's cell phone is [phone].  Patient has agreed to have [facility] for [facility], and PT, her insurance does not cover a Education officer, museum per [place] with [place].  Patient is a [place], for readmission.  Patient also has a f/u apt with Dr. Wyn Quaker Dibas at [facility].

## 2014-01-11 NOTE — Progress Notes (Signed)
Pt. Received discharge instructions and prescriptions. Pt. Potassium level came back within therapeutic range (5.0) Educated pt. On follow-up appointments and importance of monitoring blood sugars while on prednisone and tapering off of it. IV nurse de-accessed Port-a-cath. Family at bedside. No skin issues noted. All questions answered. No further needs noted at this time.

## 2014-01-11 NOTE — Discharge Summary (Signed)
Physician Discharge Summary  Dynasti Kerman OBS:[id number] DOB: May 01, 1955 DOA: 01/05/2014  PCP: Honor Junes, MD  Admit date: 01/05/2014 Discharge date: 01/11/2014  Time spent: 65 minutes  Recommendations for Outpatient Follow-up:  1.  FU with Infectious Disease/ HIV clinic in [place], [place] for HIV genotyping results, and assessment of HIV management. 2. FU with [facility] for scheduled chemotherapy appointment. 3. FU with PCP for repeat CBC, BMET for reassessment of hyperkalemia and anemia. 4. Discharge home with HHPT  Discharge Diagnoses:  Principal Problem:   HCAP (healthcare-associated pneumonia) Active Problems:   DM (diabetes mellitus)   H/O: CVA (cerebrovascular accident)   Rheumatoid arthritis   Lymphoma   HIV disease   Anemia   PCP (pneumocystis carinii pneumonia)   Discharge Condition: Stable  Diet recommendation: Carb modified diet  Filed Weights   01/05/14 2228  Weight: 66.9 kg (147 lb 7.8 oz)    History of present illness:  Bailey Moss is a 58 yo female with PMH of B-cell lymphoma ( on chemotherapy), hypertension, diabetes mellitus, asthma/ COPD ( recent admission 11 days ago for COPD exacerbation), previous stroke (deficits of difficulty with memory, generalized weakness), and arthritis that presented to the ED with Worsening fever, fatigue, and non productive cough for the past 3 days. She denies any shortness of breath, chest pain, abdominal pain.in the ED, patient was found to be septic, and CT chest with evidence of ground glass opacities suggestive of infection. Patient is admitted for further management.  Hospital Course:   Sepsis secondary to pneumonia -Resolved, and stable on discharge, with no fever or leukocytosis.  Presented with fever tachycardia and pneumonia on CT. Blood/urine cultures with no growth.  Patient was maintained on IV antibiotics, and hydrated with IV fluids. This was thought to be due to PCP pneumonia  PCP  pneumonia -Presented with fever, tachycardia, leukocytosis, and cough. CT with evidence of groundglass opacities bilaterally suggestive of pneumonia.  Patient was placed on IV Cefepime and Vancomycin, however, developed shortness of breath, requiring supplemental oxygen via [place]. Patient then had a positive HIV screen, Infectious Disease was consulted, and antibiotic regimen switched to IV bactrim and IV steroids, for coverage of PCP. Levaquin was added to regimen after evidence of legionella on urine antigen.  Patient subsequently developed hyperkalemia, likely secondary to bactrim as it was temporarily related.  Bactrim is discontinued, and antibiotic regimen is switched to Primaquin, Clindamycin, and Levaquin. Patient was also maintained on bronchodilators, mucolytic, and antitussives.  -On discharge, patient with no fever, leukocytosis, with 97% O2 sats on RA.   -On discharge, Primaquin 30mg  daily for 19 days, clindamycin 600mg  TID for 19 days, Levofloxacin 500mg  for 8 days, and Prednisone taper. - She has follow-up with infectious disease next week  Legionella Pneumonia -With evidence of legionella on urine antigen testing.  -On discharge, continue Levofloxacin 500mg  for 8 additional days.  Hyperkalemia -Pt with K 6.2, suspected secondary to Bactrim use. Treated with kayexalate, and bactrim is discontinued.  IV antibiotic is changed to oral Pimaquine and Clindamycin -On discharge, continue primaquin 30mg  daily and clindamycin 600mg  TID. - Potassium corrected to 5 after Kayexalate, patient will be seen tomorrow with her oncologist in [place] and she was instructed to have a potassium level rechecked. She expressed understanding.  HIV/AIDS -Patient had a positive HIV screen with CD4 count of 70.  Infectious Disease was consulted, and patient was treated with IV bactrim/ steroids empirically for PCP.  Additionally, legionella antigen testing was positive. Further testing with RPR/Hepatits panel  was  negative.  -Will set her up with the HIV clinic locally -Follow up with ID clinic in [facility] for HIV genotyping,  And ARV therapy  B-cell lymphoma -Patient currently on chemotherapy, next treatment 12/14 in [place], [place]. Patient follows up at the [facility] clinic for lymphoma, however, no records of recent HIV screen. States will go back to [place] upon discharge but will come back to [place] to be set up with HIV clinic locally.  Normocytic  Anemia Hemoglobin stable at discharge at 9.4, with no current/history of bleeding. Patient with an episode of  7.7 hemoglobin during hospitalization, for which she received 1 unit of PRBC.  -FU with PCP for hemoglobin recheck  HTN -BP stable on discharge -Continue Lopressor 12.5mg  BID  Diabetes Mellitus -Hgb A1c 7.5.  Patient with increase in CBGs during hospitalization, secondary to prednisone use.  Was maintained on Levemir and sliding scale insulin. - Her Levemir dose was increased on discharge, we'll recommend outpatient follow-up, suspect she will need to come back down the dose as her prednisone will be tapered down  Constipation -Continue Miralax and [place] PRN  Procedures:  None  Consultations:  Infectious Disease  Discharge Exam: Filed Vitals:   01/11/14 1007  BP: 112/71  Pulse:   Temp:   Resp:      Exam General: Alert AA female in NAD.  Cardiovascular: Regular rate and rhythm.  No murmurs, rubs, or gallops. Respiratory: Clear to auscultate bilaterally.  No rhonchi or crepitations. Abdomen: Soft nontender bowel sounds present. No guarding or rigidity.  Musculoskeletal: No edema.  Psychiatric: Appears normal.     Discharge Instructions     Discharge Instructions    Diet - low sodium heart healthy    Complete by:  As directed      Diet Carb Modified    Complete by:  As directed      Increase activity slowly    Complete by:  As directed             Medication List    TAKE these medications        acetaminophen  325 MG tablet  Commonly known as:  TYLENOL  Take 650 mg by mouth every 6 (six) hours as needed for fever.     albuterol (2.5 MG/3ML) 0.083% nebulizer solution  Commonly known as:  PROVENTIL  Take 2.5 mg by nebulization every 6 (six) hours as needed for wheezing or shortness of breath.     albuterol 108 (90 BASE) MCG/ACT inhaler  Commonly known as:  PROVENTIL HFA;VENTOLIN HFA  Inhale 2 puffs into the lungs every 6 (six) hours as needed for wheezing or shortness of breath.     clindamycin 300 MG capsule  Commonly known as:  CLEOCIN  Take 2 capsules (600 mg total) by mouth 3 (three) times daily.     DELTASONE 20 MG tablet  Generic drug:  predniSONE  Take 1 tablet (20 mg total) by mouth daily.     Fluticasone-Salmeterol 500-50 MCG/DOSE Aepb  Commonly known as:  ADVAIR  Inhale 1 puff into the lungs 2 (two) times daily.     folic acid 1 MG tablet  Commonly known as:  FOLVITE  Take 1 mg by mouth daily.     glimepiride 4 MG tablet  Commonly known as:  AMARYL  Take 4 mg by mouth daily with breakfast.     LEVEMIR FLEXTOUCH 100 UNIT/ML Pen  Generic drug:  Insulin Detemir  Inject 10 Units into the skin every evening.  levofloxacin 500 MG tablet  Commonly known as:  LEVAQUIN  Take 1 tablet (500 mg total) by mouth daily at 6 PM.     metFORMIN 1000 MG tablet  Commonly known as:  GLUCOPHAGE  Take 1 tablet (1,000 mg total) by mouth 2 (two) times daily with a meal.     metoprolol tartrate 25 MG tablet  Commonly known as:  LOPRESSOR  Take 0.5 tablets (12.5 mg total) by mouth 2 (two) times daily.     MUCUS-DM 30-600 MG Tb12  Take 1 tablet by mouth every 12 (twelve) hours as needed (Cough and congestion).     primaquine 26.3 MG tablet  Take 2 tablets (30 mg total) by mouth daily.     Vitamin D (Ergocalciferol) 50000 UNITS Caps capsule  Commonly known as:  DRISDOL  Take 50,000 Units by mouth 3 (three) times a week. On Monday, Wednesday and Friday.       Allergies  Allergen  Reactions  . Aspirin Other (See Comments)    Blood sugar goes up   Follow-up Information    Follow up with Lujean Amel, MD On 01/17/2014.   Specialty:  Family Medicine   Why:  2:45 for hospital follow up    Contact information:   [address] [place] [id number] [phone]        The results of significant diagnostics from this hospitalization (including imaging, microbiology, ancillary and laboratory) are listed below for reference.    Significant Diagnostic Studies: Ct Angio Chest Pe W/cm &/or Wo Cm  01/05/2014   CLINICAL DATA:  Two day history of shortness of breath.  EXAM: CT ANGIOGRAPHY CHEST WITH CONTRAST  TECHNIQUE: Multidetector CT imaging of the chest was performed using the standard protocol during bolus administration of intravenous contrast. Multiplanar CT image reconstructions and MIPs were obtained to evaluate the vascular anatomy.  CONTRAST:  185mL OMNIPAQUE IOHEXOL 350 MG/ML IV.  COMPARISON:  CTA chest 12/23/2013, 09/08/2013.  FINDINGS: As on the examination 2 weeks ago, no filling defects within either main pulmonary artery or their branches in either lung. Heart size remains normal. No pericardial effusion. No visible coronary atherosclerosis. No visible atherosclerosis involving the thoracic aorta.  Scattered patchy ground-glass airspace opacities throughout both lungs, most prominent in the upper lobes, new since the prior examination. No pulmonary parenchymal nodules or masses. No pleural effusions. Linear scarring medially in the deep right lower lobe.  No significant mediastinal or hilar lymphadenopathy. 2.6 x 1.4 cm and 2.6 x 1.5 cm respectively, unchanged since the examination 2 weeks ago, significantly improved since the examination in August. No evidence of right axillary lymphadenopathy.  Approximate 3.5 cm accessory splenule in the left upper quadrant of the visualized abdomen as noted previously. Visualized upper abdomen unremarkable for  the early arterial phase of enhancement. Bone window images unremarkable.  Review of the MIP images confirms the above findings.  IMPRESSION: 1. No evidence of pulmonary embolism. 2. Ground-glass airspace opacities in both lungs, most prominent in the upper lobes, likely inflammatory or infectious. 3. Stable left axillary lymphadenopathy since the examination 2 weeks ago, much improved since August. No lymphadenopathy elsewhere.   Electronically Signed   By: Evangeline Dakin M.D.   On: 01/05/2014 20:08   Ct Angio Chest Pe W/cm &/or Wo Cm  12/23/2013   CLINICAL DATA:  Acute onset of shortness of breath, with productive cough. Initial encounter.  EXAM: CT ANGIOGRAPHY CHEST WITH CONTRAST  TECHNIQUE: Multidetector CT imaging of the chest was performed using  the standard protocol during bolus administration of intravenous contrast. Multiplanar CT image reconstructions and MIPs were obtained to evaluate the vascular anatomy.  CONTRAST:  145mL OMNIPAQUE IOHEXOL 350 MG/ML SOLN  COMPARISON:  Chest radiograph performed earlier today at 12:46 a.m.  FINDINGS: There is no evidence of pulmonary embolus.  Bibasilar atelectasis is noted. The lungs are otherwise clear. There is no evidence of significant focal consolidation, pleural effusion or pneumothorax. No masses are identified; no abnormal focal contrast enhancement is seen.  The mediastinum is unremarkable in appearance. No mediastinal lymphadenopathy is seen. No pericardial effusion is identified. The great vessels are grossly unremarkable in appearance. A right-sided chest port is noted ending within the right atrium.  Left axillary lymphadenopathy is improved from the prior study. Previously noted enlarged nodes at the left upper quadrant are partially seen. The visualized portions of the thyroid gland are unremarkable in appearance.  The visualized portions of the liver and spleen are unremarkable.  No acute osseous abnormalities are seen.  Review of the MIP images  confirms the above findings.  IMPRESSION: 1. No evidence of pulmonary embolus. 2. Bibasilar atelectasis noted; lungs otherwise clear. 3. Interval improvement in left axillary lymphadenopathy; enlarged node again noted at the left upper quadrant.   Electronically Signed   By: Garald Balding M.D.   On: 12/23/2013 04:52   Dg Chest Port 1 View  01/05/2014   CLINICAL DATA:  58 year old female with a two-day history of fever  EXAM: PORTABLE CHEST - 1 VIEW  COMPARISON:  Prior chest x-ray 12/15/2013  FINDINGS: Right IJ approach single-lumen power injectable port catheter. Catheter tip in good position in the upper right atrium. Cardiac and mediastinal contours are within normal limits. Minimal bibasilar atelectasis unchanged compared to recent prior. No new focal airspace consolidation, pulmonary edema, pleural effusion or pneumothorax. No acute osseous abnormality.  IMPRESSION: 1. Low inspiratory volumes with minimal bibasilar atelectasis. 2. No acute cardiopulmonary process. 3. Right IJ approach single-lumen power injectable port catheter with the tip in good position overlying the upper right atrium.   Electronically Signed   By: Jacqulynn Cadet M.D.   On: 01/05/2014 16:09   Dg Chest Port 1 View  12/23/2013   CLINICAL DATA:  Acute onset of shortness of breath and dyspnea. Follow-up study.  EXAM: PORTABLE CHEST - 1 VIEW  COMPARISON:  Chest radiograph performed earlier today at 12:46 a.m., and CTA of the chest performed earlier today at 4:26 a.m.  FINDINGS: The lungs are well-aerated. Mild vascular congestion is noted. Mildly increased interstitial markings are noted, new from the prior study, raising question for mild interstitial edema. No pleural effusion or pneumothorax is seen.  The cardiomediastinal silhouette is normal in size. A right-sided chest port is noted ending about the cavoatrial junction. No acute osseous abnormalities are seen.  IMPRESSION: Vascular congestion noted. Mildly interstitial markings  are new from the prior study and raise question for mild acute interstitial edema.   Electronically Signed   By: Garald Balding M.D.   On: 12/23/2013 07:03   Dg Chest Port 1 View  12/23/2013   CLINICAL DATA:  Acute onset of moderate shortness of breath, with productive cough. Initial encounter.  EXAM: PORTABLE CHEST - 1 VIEW  COMPARISON:  Chest radiograph and CTA of the chest performed 09/08/2013  FINDINGS: The lungs are well-aerated. Minimal bibasilar atelectasis is noted. There is no evidence of pleural effusion or pneumothorax.  The cardiomediastinal silhouette is within normal limits. No acute osseous abnormalities are seen. A right-sided  chest port is noted ending about the proximal right atrium.  IMPRESSION: Minimal bibasilar atelectasis noted; lungs otherwise clear.   Electronically Signed   By: Garald Balding M.D.   On: 12/23/2013 01:33    Microbiology: Recent Results (from the past 240 hour(s))  Blood Culture (routine x 2)     Status: None   Collection Time: 01/05/14  4:00 PM  Result Value Ref Range Status   Specimen Description BLOOD PORTA CATH  Final   Special Requests BOTTLES DRAWN AEROBIC AND ANAEROBIC 5CC  Final   Culture  Setup Time   Final    01/05/2014 22:41 Performed at [facility]    Culture   Final    NO GROWTH 5 DAYS Performed at [facility]    Report Status 01/11/2014 FINAL  Final  Blood Culture (routine x 2)     Status: None   Collection Time: 01/05/14  4:12 PM  Result Value Ref Range Status   Specimen Description BLOOD ARM RIGHT  Final   Special Requests BOTTLES DRAWN AEROBIC AND ANAEROBIC 5CC  Final   Culture  Setup Time   Final    01/05/2014 22:42 Performed at [facility]    Culture   Final    NO GROWTH 5 DAYS Performed at [facility]    Report Status 01/11/2014 FINAL  Final  Urine culture     Status: None   Collection Time: 01/05/14  5:13 PM  Result Value Ref Range Status   Specimen Description URINE, CATHETERIZED   Final   Special Requests NONE  Final   Culture  Setup Time   Final    01/05/2014 17:45 Performed at [facility] Performed at [facility]   Final   Culture NO GROWTH Performed at [facility]   Final   Report Status 01/06/2014 FINAL  Final  Culture, blood (routine x 2)     Status: None (Preliminary result)   Collection Time: 01/07/14  2:56 PM  Result Value Ref Range Status   Specimen Description BLOOD LEFT HAND  Final   Special Requests BOTTLES DRAWN AEROBIC AND ANAEROBIC 5 CC  Final   Culture  Setup Time   Final    01/07/2014 20:59 Performed at [facility]    Culture   Final           BLOOD CULTURE RECEIVED NO GROWTH TO DATE CULTURE WILL BE HELD FOR 5 DAYS BEFORE ISSUING A FINAL NEGATIVE REPORT Performed at [facility]    Report Status PENDING  Incomplete  Culture, blood (routine x 2)     Status: None (Preliminary result)   Collection Time: 01/07/14  3:01 PM  Result Value Ref Range Status   Specimen Description BLOOD RIGHT ARM  Final   Special Requests BOTTLES DRAWN AEROBIC AND ANAEROBIC 5 CC  Final   Culture  Setup Time   Final    01/07/2014 20:59 Performed at [facility]    Culture   Final           BLOOD CULTURE RECEIVED NO GROWTH TO DATE CULTURE WILL BE HELD FOR 5 DAYS BEFORE ISSUING A FINAL NEGATIVE REPORT Performed at [facility]    Report Status PENDING  Incomplete     Labs: Basic Metabolic Panel:  Recent Labs Lab 01/05/14 1541 01/07/14 0459 01/08/14 0400 01/09/14 0515 01/11/14 0500  NA 128* 135* 131* 135* 132*  K 4.4 3.5* 3.8  4.8 6.2*  CL 91* 100 99 100 99  CO2 20 21 19 19 19   GLUCOSE 222* 79 188* 178* 225*  BUN 20 19 14 13  33*  CREATININE 0.96 0.65 0.67 0.75 0.97  CALCIUM 8.9 8.3* 8.4 9.6 10.2   Liver Function Tests:  Recent Labs Lab 01/05/14 1541 01/11/14 0500  AST 50* 31  ALT 41* 42*  ALKPHOS 103 121*  BILITOT 0.5 <0.2*  PROT 6.8 7.3  ALBUMIN 2.6*  2.9*   No results for input(s): LIPASE, AMYLASE in the last 168 hours. No results for input(s): AMMONIA in the last 168 hours. CBC:  Recent Labs Lab 01/05/14 1541 01/07/14 0459 01/08/14 0400 01/09/14 0515 01/11/14 0500  WBC 8.0 7.6 5.0 3.3* 5.3  NEUTROABS 5.4  --   --   --   --   HGB 9.6* 8.5* 7.7* 9.2* 9.4*  HCT 28.1* 26.0* 23.1* 27.5* 28.7*  MCV 90.4 90.0 89.5 87.3 88.0  PLT 96* 103* 92* 100* 114*   Cardiac Enzymes:  Recent Labs Lab 01/05/14 1541  TROPONINI <0.30   BNP: BNP (last 3 results)  Recent Labs  12/22/13 2321  PROBNP 76.3   CBG:  Recent Labs Lab 01/10/14 1150 01/10/14 1658 01/10/14 2212 01/11/14 0756 01/11/14 1141  GLUCAP 238* 397* 280* 167* 275*    Signed:  Lacy Duverney PA-C  [facility] 01/11/2014, 3:50 PM   Patient was seen, examined,treatment plan was discussed with the Advance Practice Provider.  I have directly reviewed the clinical findings, lab, imaging studies and management of this patient in detail. I have made the necessary changes to the above noted documentation, and agree with the documentation, as recorded by the Advance Practice Provider.   Marzetta Board, MD [facility] [phone]

## 2014-01-11 NOTE — Evaluation (Signed)
Physical Therapy Evaluation and Discharge Patient Details Name: Bailey Moss MRN: [id number] DOB: 02/17/1955 Today's Date: 01/11/2014   History of Present Illness  Patient presents to [facility] with 3 day history of fever, worsening cough, fatigue. Found to have HCAP, with active problem list to include DM, hx of CVA, rheumatoid arthritis, lymphoma, HIV disease, anemia, and pneumocystis carinii pneumonia.  Clinical Impression  Patient evaluated by Physical Therapy with no further acute PT needs identified. All education has been completed and the patient has no further questions. She will have 24 hour care at home from family as needed. Ambulating up to 225 feet with min guard assist while using a rolling walker. VSS throughout therapy session. Recommend 3-in-1 due to pt bathing on rollator seat at home. HHPT to progress independence and decrease burden of care. See below for any follow-up Physial Therapy or equipment needs. PT is signing off. Thank you for this referral.     Follow Up Recommendations Home health PT    Equipment Recommendations  3in1 (PT)    Recommendations for Other Services       Precautions / Restrictions Precautions Precautions: None Precaution Comments: neutropenic Restrictions Weight Bearing Restrictions: No      Mobility  Bed Mobility                  Transfers Overall transfer level: Needs assistance Equipment used: Rolling walker (2 wheeled) Transfers: Sit to/from Stand Sit to Stand: Supervision         General transfer comment: Supervision for safety from lowest bed setting x2 and commode with use of hand rail. VC for hand placement.   Ambulation/Gait Ambulation/Gait assistance: Min guard Ambulation Distance (Feet): 225 Feet Assistive device: Rolling walker (2 wheeled) Gait Pattern/deviations: Step-through pattern;Decreased stride length;Staggering right Gait velocity: decreased   General Gait Details: Educated on safe DME use with a  rolling walker. Min guard for safety, with mild stagger towards right noted intermittently but able to self correct. VC for forward gaze while ambulating. HR to 109, SpO2 95% while on room air.  Stairs            Wheelchair Mobility    Modified Rankin (Stroke Patients Only)       Balance Overall balance assessment: Needs assistance Sitting-balance support: No upper extremity supported;Feet supported Sitting balance-Leahy Scale: Good     Standing balance support: No upper extremity supported Standing balance-Leahy Scale: Fair                               Pertinent Vitals/Pain Pain Assessment: No/denies pain    Home Living Family/patient expects to be discharged to:: Private residence Living Arrangements: Children Available Help at Discharge: Family;Available 24 hours/day Type of Home: Mobile home Home Access: Stairs to enter Entrance Stairs-Rails: Right Entrance Stairs-Number of Steps: 4-5 Home Layout: One level Home Equipment: Cane - single point;Walker - 4 wheels      Prior Function Level of Independence: Needs assistance   Gait / Transfers Assistance Needed: uses rollator for ambulation  ADL's / Homemaking Assistance Needed: Daughter assists with bathing.        Hand Dominance   Dominant Hand: Right    Extremity/Trunk Assessment   Upper Extremity Assessment: Defer to OT evaluation           Lower Extremity Assessment: Generalized weakness         Communication   Communication: No difficulties  Cognition Arousal/Alertness: Awake/alert Behavior During  Therapy: WFL for tasks assessed/performed Overall Cognitive Status: History of cognitive impairments - at baseline (Per family)                      General Comments General comments (skin integrity, edema, etc.): Pt able to change Depend Brief independently in bathroom    Exercises        Assessment/Plan    PT Assessment All further PT needs can be met in the  next venue of care  PT Diagnosis Abnormality of gait;Generalized weakness   PT Problem List Decreased strength;Decreased balance;Decreased mobility;Decreased cognition;Decreased knowledge of use of DME  PT Treatment Interventions     PT Goals (Current goals can be found in the Care Plan section) Acute Rehab PT Goals Patient Stated Goal: Go home PT Goal Formulation: All assessment and education complete, DC therapy    Frequency     Barriers to discharge        Co-evaluation               End of Session Equipment Utilized During Treatment: Gait belt Activity Tolerance: Patient tolerated treatment well Patient left: in bed;with call bell/phone within reach;with family/visitor present Nurse Communication: Mobility status         Time: 0272-5366 PT Time Calculation (min) (ACUTE ONLY): 24 min   Charges:   PT Evaluation $Initial PT Evaluation Tier I: 1 Procedure PT Treatments $Gait Training: 8-22 mins   PT G CodesEllouise Newer 01/11/2014, 11:25 AM  Elayne Snare, [place]

## 2014-01-11 NOTE — Discharge Instructions (Signed)
Follow with SCHLEUPNER, MARK A, MD in 5-7 days  Please follow up with oncology in [place] tomorrow. Please ask to have a repeat potassium level.   Please get a complete blood count and chemistry panel checked by your Primary MD at your next visit, and again as instructed by your Primary MD. Please get your medications reviewed and adjusted by your Primary MD.  Please request your Primary MD to go over all Hospital Tests and Procedure/Radiological results at the follow up, please get all Hospital records sent to your Prim MD by signing hospital release before you go home.  If you had Pneumonia of Lung problems at the Hospital: Please get a 2 view Chest X ray done in 6-8 weeks after hospital discharge or sooner if instructed by your Primary MD.  If you have Congestive Heart Failure: Please call your Cardiologist or Primary MD anytime you have any of the following symptoms:  1) 3 pound weight gain in 24 hours or 5 pounds in 1 week  2) shortness of breath, with or without a dry hacking cough  3) swelling in the hands, feet or stomach  4) if you have to sleep on extra pillows at night in order to breathe  Follow cardiac low salt diet and 1.5 lit/day fluid restriction.  If you have diabetes Accuchecks 4 times/day, Once in AM empty stomach and then before each meal. Log in all results and show them to your primary doctor at your next visit. If any glucose reading is under 80 or above 300 call your primary MD immediately.  If you have Seizure/Convulsions/Epilepsy: Please do not drive, operate heavy machinery, participate in activities at heights or participate in high speed sports until you have seen by Primary MD or a Neurologist and advised to do so again.  If you had Gastrointestinal Bleeding: Please ask your Primary MD to check a complete blood count within one week of discharge or at your next visit. Your endoscopic/colonoscopic biopsies that are pending at the time of discharge, will also  need to followed by your Primary MD.  Get Medicines reviewed and adjusted. Please take all your medications with you for your next visit with your Primary MD  Please request your Primary MD to go over all hospital tests and procedure/radiological results at the follow up, please ask your Primary MD to get all Hospital records sent to his/her office.  If you experience worsening of your admission symptoms, develop shortness of breath, life threatening emergency, suicidal or homicidal thoughts you must seek medical attention immediately by calling 911 or calling your MD immediately  if symptoms less severe.  You must read complete instructions/literature along with all the possible adverse reactions/side effects for all the Medicines you take and that have been prescribed to you. Take any new Medicines after you have completely understood and accpet all the possible adverse reactions/side effects.   Do not drive or operate heavy machinery when taking Pain medications.   Do not take more than prescribed Pain, Sleep and Anxiety Medications  Special Instructions: If you have smoked or chewed Tobacco  in the last 2 yrs please stop smoking, stop any regular Alcohol  and or any Recreational drug use.  Wear Seat belts while driving.  Please note You were cared for by a hospitalist during your hospital stay. If you have any questions about your discharge medications or the care you received while you were in the hospital after you are discharged, you can call the unit and asked  to speak with the hospitalist on call if the hospitalist that took care of you is not available. Once you are discharged, your primary care physician will handle any further medical issues. Please note that NO REFILLS for any discharge medications will be authorized once you are discharged, as it is imperative that you return to your primary care physician (or establish a relationship with a primary care physician if you do not have  one) for your aftercare needs so that they can reassess your need for medications and monitor your lab values.  You can reach the hospitalist office at phone [phone] or fax [phone]   If you do not have a primary care physician, you can call [phone] for a physician referral.  Activity: As tolerated with Full fall precautions use walker/cane & assistance as needed  Diet: diabetic  Disposition Home

## 2014-01-13 ENCOUNTER — Encounter (HOSPITAL_COMMUNITY): Payer: Self-pay | Admitting: Emergency Medicine

## 2014-01-13 ENCOUNTER — Emergency Department (HOSPITAL_COMMUNITY)
Admission: EM | Admit: 2014-01-13 | Discharge: 2014-01-13 | Disposition: A | Discharge status: Home / Self Care | Payer: Federal, State, Local not specified - PPO | Attending: Emergency Medicine | Admitting: Emergency Medicine

## 2014-01-13 DIAGNOSIS — Z792 Long term (current) use of antibiotics: Secondary | ICD-10-CM | POA: Insufficient documentation

## 2014-01-13 DIAGNOSIS — D649 Anemia, unspecified: Secondary | ICD-10-CM | POA: Insufficient documentation

## 2014-01-13 DIAGNOSIS — Z7951 Long term (current) use of inhaled steroids: Secondary | ICD-10-CM | POA: Diagnosis not present

## 2014-01-13 DIAGNOSIS — Z7952 Long term (current) use of systemic steroids: Secondary | ICD-10-CM | POA: Insufficient documentation

## 2014-01-13 DIAGNOSIS — Z79899 Other long term (current) drug therapy: Secondary | ICD-10-CM | POA: Insufficient documentation

## 2014-01-13 DIAGNOSIS — J45909 Unspecified asthma, uncomplicated: Secondary | ICD-10-CM | POA: Diagnosis not present

## 2014-01-13 DIAGNOSIS — Z794 Long term (current) use of insulin: Secondary | ICD-10-CM | POA: Insufficient documentation

## 2014-01-13 DIAGNOSIS — E1165 Type 2 diabetes mellitus with hyperglycemia: Secondary | ICD-10-CM | POA: Diagnosis not present

## 2014-01-13 DIAGNOSIS — Z8673 Personal history of transient ischemic attack (TIA), and cerebral infarction without residual deficits: Secondary | ICD-10-CM | POA: Diagnosis not present

## 2014-01-13 DIAGNOSIS — M1990 Unspecified osteoarthritis, unspecified site: Secondary | ICD-10-CM | POA: Insufficient documentation

## 2014-01-13 DIAGNOSIS — Z8701 Personal history of pneumonia (recurrent): Secondary | ICD-10-CM | POA: Insufficient documentation

## 2014-01-13 DIAGNOSIS — Z859 Personal history of malignant neoplasm, unspecified: Secondary | ICD-10-CM | POA: Insufficient documentation

## 2014-01-13 DIAGNOSIS — I10 Essential (primary) hypertension: Secondary | ICD-10-CM | POA: Insufficient documentation

## 2014-01-13 DIAGNOSIS — R739 Hyperglycemia, unspecified: Secondary | ICD-10-CM

## 2014-01-13 LAB — CBC WITH DIFFERENTIAL/PLATELET
BASOS PCT: 0 % (ref 0–1)
Basophils Absolute: 0 10*3/uL (ref 0.0–0.1)
EOS PCT: 0 % (ref 0–5)
Eosinophils Absolute: 0 10*3/uL (ref 0.0–0.7)
HCT: 33 % — ABNORMAL LOW (ref 36.0–46.0)
HEMOGLOBIN: 10.8 g/dL — AB (ref 12.0–15.0)
LYMPHS PCT: 8 % — AB (ref 12–46)
Lymphs Abs: 0.4 10*3/uL — ABNORMAL LOW (ref 0.7–4.0)
MCH: 29.9 pg (ref 26.0–34.0)
MCHC: 32.7 g/dL (ref 30.0–36.0)
MCV: 91.4 fL (ref 78.0–100.0)
MONOS PCT: 7 % (ref 3–12)
Monocytes Absolute: 0.4 10*3/uL (ref 0.1–1.0)
Neutro Abs: 4.5 10*3/uL (ref 1.7–7.7)
Neutrophils Relative %: 85 % — ABNORMAL HIGH (ref 43–77)
Platelets: 112 10*3/uL — ABNORMAL LOW (ref 150–400)
RBC: 3.61 MIL/uL — AB (ref 3.87–5.11)
RDW: 15.8 % — ABNORMAL HIGH (ref 11.5–15.5)
WBC: 5.3 10*3/uL (ref 4.0–10.5)

## 2014-01-13 LAB — CULTURE, BLOOD (ROUTINE X 2)
CULTURE: NO GROWTH
Culture: NO GROWTH

## 2014-01-13 LAB — BASIC METABOLIC PANEL
Anion gap: 15 (ref 5–15)
BUN: 39 mg/dL — ABNORMAL HIGH (ref 6–23)
CO2: 20 mEq/L (ref 19–32)
Calcium: 10.3 mg/dL (ref 8.4–10.5)
Chloride: 97 mEq/L (ref 96–112)
Creatinine, Ser: 0.99 mg/dL (ref 0.50–1.10)
GFR calc Af Amer: 70 mL/min — ABNORMAL LOW (ref >90)
GFR, EST NON AFRICAN AMERICAN: 61 mL/min — AB (ref >90)
GLUCOSE: 369 mg/dL — AB (ref 70–99)
POTASSIUM: 5.3 meq/L (ref 3.7–5.3)
SODIUM: 132 meq/L — AB (ref 137–147)

## 2014-01-13 LAB — HLA B*5701: HLA B 5701: NEGATIVE

## 2014-01-13 LAB — CBG MONITORING, ED
GLUCOSE-CAPILLARY: 364 mg/dL — AB (ref 70–99)
GLUCOSE-CAPILLARY: 330 mg/dL — AB (ref 70–99)

## 2014-01-13 MED ORDER — SODIUM CHLORIDE 0.9 % IV BOLUS (SEPSIS)
1000.0000 mL | Freq: Once | INTRAVENOUS | Status: AC
  Administered 2014-01-13: 1000 mL via INTRAVENOUS

## 2014-01-13 NOTE — ED Notes (Signed)
Pt's home healthcare nurse checked blood sugar today and it was in the 360's. Pt on prednisone for pneumonia. Pt's family member states pt is on Levemir but doesn't think she has been using it correctly. Denies pain. No SOB or dizziness.

## 2014-01-13 NOTE — Discharge Instructions (Signed)
You will need to closely monitor your blood sugars while taking prednisone.  Please take your medications as prescribed.  Hyperglycemia Hyperglycemia occurs when the glucose (sugar) in your blood is too high. Hyperglycemia can happen for many reasons, but it most often happens to people who do not know they have diabetes or are not managing their diabetes properly.  CAUSES  Whether you have diabetes or not, there are other causes of hyperglycemia. Hyperglycemia can occur when you have diabetes, but it can also occur in other situations that you might not be as aware of, such as: Diabetes  If you have diabetes and are having problems controlling your blood glucose, hyperglycemia could occur because of some of the following reasons:  Not following your meal plan.  Not taking your diabetes medications or not taking it properly.  Exercising less or doing less activity than you normally do.  Being sick. Pre-diabetes  This cannot be ignored. Before people develop Type 2 diabetes, they almost always have "pre-diabetes." This is when your blood glucose levels are higher than normal, but not yet high enough to be diagnosed as diabetes. Research has shown that some long-term damage to the body, especially the heart and circulatory system, may already be occurring during pre-diabetes. If you take action to manage your blood glucose when you have pre-diabetes, you may delay or prevent Type 2 diabetes from developing. Stress  If you have diabetes, you may be "diet" controlled or on oral medications or insulin to control your diabetes. However, you may find that your blood glucose is higher than usual in the hospital whether you have diabetes or not. This is often referred to as "stress hyperglycemia." Stress can elevate your blood glucose. This happens because of hormones put out by the body during times of stress. If stress has been the cause of your high blood glucose, it can be followed regularly by your  caregiver. That way he/she can make sure your hyperglycemia does not continue to get worse or progress to diabetes. Steroids  Steroids are medications that act on the infection fighting system (immune system) to block inflammation or infection. One side effect can be a rise in blood glucose. Most people can produce enough extra insulin to allow for this rise, but for those who cannot, steroids make blood glucose levels go even higher. It is not unusual for steroid treatments to "uncover" diabetes that is developing. It is not always possible to determine if the hyperglycemia will go away after the steroids are stopped. A special blood test called an A1c is sometimes done to determine if your blood glucose was elevated before the steroids were started. SYMPTOMS  Thirsty.  Frequent urination.  Dry mouth.  Blurred vision.  Tired or fatigue.  Weakness.  Sleepy.  Tingling in feet or leg. DIAGNOSIS  Diagnosis is made by monitoring blood glucose in one or all of the following ways:  A1c test. This is a chemical found in your blood.  Fingerstick blood glucose monitoring.  Laboratory results. TREATMENT  First, knowing the cause of the hyperglycemia is important before the hyperglycemia can be treated. Treatment may include, but is not be limited to:  Education.  Change or adjustment in medications.  Change or adjustment in meal plan.  Treatment for an illness, infection, etc.  More frequent blood glucose monitoring.  Change in exercise plan.  Decreasing or stopping steroids.  Lifestyle changes. HOME CARE INSTRUCTIONS   Test your blood glucose as directed.  Exercise regularly. Your caregiver will  give you instructions about exercise. Pre-diabetes or diabetes which comes on with stress is helped by exercising.  Eat wholesome, balanced meals. Eat often and at regular, fixed times. Your caregiver or nutritionist will give you a meal plan to guide your sugar intake.  Being at  an ideal weight is important. If needed, losing as little as 10 to 15 pounds may help improve blood glucose levels. SEEK MEDICAL CARE IF:   You have questions about medicine, activity, or diet.  You continue to have symptoms (problems such as increased thirst, urination, or weight gain). SEEK IMMEDIATE MEDICAL CARE IF:   You are vomiting or have diarrhea.  Your breath smells fruity.  You are breathing faster or slower.  You are very sleepy or incoherent.  You have numbness, tingling, or pain in your feet or hands.  You have chest pain.  Your symptoms get worse even though you have been following your caregiver's orders.  If you have any other questions or concerns. Document Released: 07/08/2000 Document Revised: 04/06/2011 Document Reviewed: 05/11/2011 [facility] Patient Information 2015 [place], [place]. This information is not intended to replace advice given to you by your health care provider. Make sure you discuss any questions you have with your health care provider.

## 2014-01-13 NOTE — ED Provider Notes (Signed)
CSN: [id number]     Arrival date & time 01/13/14  1713 History   First MD Initiated Contact with Patient 01/13/14 1722     Chief Complaint  Patient presents with  . Hyperglycemia     (Consider location/radiation/quality/duration/timing/severity/associated sxs/prior Treatment) HPI Comments: Patient presents to the emergency department with chief complaint of hyperglycemia. She was just discharged 2 days ago after being admitted for HCAP.  She states that she has been feeling better, but was discharged with prednisone. She states that she was told that the prednisone can cause her sugar to run high. When she checked it today, her sugar was in the 360s. Her home health care nurse advised her to come to the emergency department for further assessment and evaluation. Patient denies any new symptoms at this time. She states that she feels normal. She denies any chest pain or shortness of breath. She reports improvement of her cough. And is getting her strength back. She is taking her medications as prescribed with the exception of missing a dose of Levemir today.  The history is provided by the patient. No language interpreter was used.    Past Medical History  Diagnosis Date  . Hypertension   . Type II diabetes mellitus   . Chronic bronchitis     "get it q yr" (09/08/2013)  . Pneumonia 2013; 2014; 2015  . Asthma   . Asthmatic bronchitis , chronic   . Anemia   . Stroke "~ 04/2013    "memory problems; weak all over since" (09/08/2013)  . Arthritis     "all my body"  . Memory problem   . Cancer    Past Surgical History  Procedure Laterality Date  . Tonsillectomy    . Vaginal hysterectomy      "took a tumor out too"  . Eye surgery    . Cataract extraction w/ intraocular lens  implant, bilateral Bilateral   . Cesarean section  X 1  . Tubal ligation     No family history on file. History  Substance Use Topics  . Smoking status: Never Smoker   . Smokeless tobacco: Never Used  .  Alcohol Use: No   OB History    No data available     Review of Systems  Constitutional: Negative for fever and chills.  Respiratory: Negative for shortness of breath.   Cardiovascular: Negative for chest pain.  Gastrointestinal: Negative for nausea, vomiting, diarrhea and constipation.  Genitourinary: Negative for dysuria.  All other systems reviewed and are negative.     Allergies  Aspirin  Home Medications   Prior to Admission medications   Medication Sig Start Date End Date Taking? Authorizing Provider  acetaminophen (TYLENOL) 325 MG tablet Take 650 mg by mouth every 6 (six) hours as needed for fever.    Historical Provider, MD  albuterol (PROVENTIL HFA;VENTOLIN HFA) 108 (90 BASE) MCG/ACT inhaler Inhale 2 puffs into the lungs every 6 (six) hours as needed for wheezing or shortness of breath. 09/09/13   Shanker Kristeen Mans, MD  albuterol (PROVENTIL) (2.5 MG/3ML) 0.083% nebulizer solution Take 2.5 mg by nebulization every 6 (six) hours as needed for wheezing or shortness of breath.    Historical Provider, MD  clindamycin (CLEOCIN) 300 MG capsule Take 2 capsules (600 mg total) by mouth 3 (three) times daily. 01/11/14   Costin Karlyne Greenspan, MD  Dextromethorphan-Guaifenesin (MUCUS-DM) 30-600 MG TB12 Take 1 tablet by mouth every 12 (twelve) hours as needed (Cough and congestion). 12/25/13   Debbe Odea,  MD  Fluticasone-Salmeterol (ADVAIR) 500-50 MCG/DOSE AEPB Inhale 1 puff into the lungs 2 (two) times daily. 09/09/13   Shanker Kristeen Mans, MD  folic acid (FOLVITE) 1 MG tablet Take 1 mg by mouth daily.    Historical Provider, MD  glimepiride (AMARYL) 4 MG tablet Take 4 mg by mouth daily with breakfast.    Historical Provider, MD  Insulin Detemir (LEVEMIR FLEXTOUCH) 100 UNIT/ML Pen Inject 15 Units into the skin every evening. 01/11/14   Costin Karlyne Greenspan, MD  levofloxacin (LEVAQUIN) 500 MG tablet Take 1 tablet (500 mg total) by mouth daily at 6 PM. 01/11/14   Costin Karlyne Greenspan, MD  metFORMIN  (GLUCOPHAGE) 1000 MG tablet Take 1 tablet (1,000 mg total) by mouth 2 (two) times daily with a meal. 09/10/13   Shanker Kristeen Mans, MD  metoprolol tartrate (LOPRESSOR) 25 MG tablet Take 0.5 tablets (12.5 mg total) by mouth 2 (two) times daily. 01/11/14 01/11/15  Lacy Duverney, PA-C  predniSONE (DELTASONE) 20 MG tablet Take 2 tablets (40 mg total) by mouth 2 (two) times daily with a meal. 2 tablets twice a day for 3 days. Starting 12/20 take 2 tablets daily for 5 days. Starting 12/25 take 1 tablet daily for 11 days then stop. 01/11/14   Costin Karlyne Greenspan, MD  primaquine 26.3 MG tablet Take 2 tablets (30 mg total) by mouth daily. 01/11/14   Costin Karlyne Greenspan, MD  Vitamin D, Ergocalciferol, (DRISDOL) 50000 UNITS CAPS capsule Take 50,000 Units by mouth 3 (three) times a week. On Monday, Wednesday and Friday.    Historical Provider, MD   BP 109/73 mmHg  Pulse 76  Temp(Src) 98 F (36.7 C) (Oral)  Resp 18  Ht 5\' 4"  (1.626 m)  Wt 146 lb (66.225 kg)  BMI 25.05 kg/m2  SpO2 97% Physical Exam  Constitutional: She is oriented to person, place, and time. She appears well-developed and well-nourished.  HENT:  Head: Normocephalic and atraumatic.  Eyes: Conjunctivae and EOM are normal. Pupils are equal, round, and reactive to light.  Neck: Normal range of motion. Neck supple.  Cardiovascular: Normal rate and regular rhythm.  Exam reveals no gallop and no friction rub.   No murmur heard. Pulmonary/Chest: Effort normal and breath sounds normal. No respiratory distress. She has no wheezes. She has no rales. She exhibits no tenderness.  Clear to auscultation  Abdominal: Soft. Bowel sounds are normal. She exhibits no distension and no mass. There is no tenderness. There is no rebound and no guarding.  Musculoskeletal: Normal range of motion. She exhibits no edema or tenderness.  Neurological: She is alert and oriented to person, place, and time.  Skin: Skin is warm and dry.  Psychiatric: She has a normal mood and  affect. Her behavior is normal. Judgment and thought content normal.  Nursing note and vitals reviewed.   ED Course  Procedures (including critical care time) Results for orders placed or performed during the hospital encounter of 01/13/14  CBC with Differential  Result Value Ref Range   WBC 5.3 4.0 - 10.5 K/uL   RBC 3.61 (L) 3.87 - 5.11 MIL/uL   Hemoglobin 10.8 (L) 12.0 - 15.0 g/dL   HCT 33.0 (L) 36.0 - 46.0 %   MCV 91.4 78.0 - 100.0 fL   MCH 29.9 26.0 - 34.0 pg   MCHC 32.7 30.0 - 36.0 g/dL   RDW 15.8 (H) 11.5 - 15.5 %   Platelets 112 (L) 150 - 400 K/uL   Neutrophils Relative % 85 (H) 43 -  77 %   Lymphocytes Relative 8 (L) 12 - 46 %   Monocytes Relative 7 3 - 12 %   Eosinophils Relative 0 0 - 5 %   Basophils Relative 0 0 - 1 %   Neutro Abs 4.5 1.7 - 7.7 K/uL   Lymphs Abs 0.4 (L) 0.7 - 4.0 K/uL   Monocytes Absolute 0.4 0.1 - 1.0 K/uL   Eosinophils Absolute 0.0 0.0 - 0.7 K/uL   Basophils Absolute 0.0 0.0 - 0.1 K/uL   RBC Morphology TEARDROP CELLS   Basic metabolic panel  Result Value Ref Range   Sodium 132 (L) 137 - 147 mEq/L   Potassium 5.3 3.7 - 5.3 mEq/L   Chloride 97 96 - 112 mEq/L   CO2 20 19 - 32 mEq/L   Glucose, Bld 369 (H) 70 - 99 mg/dL   BUN 39 (H) 6 - 23 mg/dL   Creatinine, Ser 0.99 0.50 - 1.10 mg/dL   Calcium 10.3 8.4 - 10.5 mg/dL   GFR calc non Af Amer 61 (L) >90 mL/min   GFR calc Af Amer 70 (L) >90 mL/min   Anion gap 15 5 - 15  CBG monitoring, ED  Result Value Ref Range   Glucose-Capillary 364 (H) 70 - 99 mg/dL  CBG monitoring, ED  Result Value Ref Range   Glucose-Capillary 330 (H) 70 - 99 mg/dL   Ct Angio Chest Pe W/cm &/or Wo Cm  01/05/2014   CLINICAL DATA:  Two day history of shortness of breath.  EXAM: CT ANGIOGRAPHY CHEST WITH CONTRAST  TECHNIQUE: Multidetector CT imaging of the chest was performed using the standard protocol during bolus administration of intravenous contrast. Multiplanar CT image reconstructions and MIPs were obtained to evaluate  the vascular anatomy.  CONTRAST:  133mL OMNIPAQUE IOHEXOL 350 MG/ML IV.  COMPARISON:  CTA chest 12/23/2013, 09/08/2013.  FINDINGS: As on the examination 2 weeks ago, no filling defects within either main pulmonary artery or their branches in either lung. Heart size remains normal. No pericardial effusion. No visible coronary atherosclerosis. No visible atherosclerosis involving the thoracic aorta.  Scattered patchy ground-glass airspace opacities throughout both lungs, most prominent in the upper lobes, new since the prior examination. No pulmonary parenchymal nodules or masses. No pleural effusions. Linear scarring medially in the deep right lower lobe.  No significant mediastinal or hilar lymphadenopathy. 2.6 x 1.4 cm and 2.6 x 1.5 cm respectively, unchanged since the examination 2 weeks ago, significantly improved since the examination in August. No evidence of right axillary lymphadenopathy.  Approximate 3.5 cm accessory splenule in the left upper quadrant of the visualized abdomen as noted previously. Visualized upper abdomen unremarkable for the early arterial phase of enhancement. Bone window images unremarkable.  Review of the MIP images confirms the above findings.  IMPRESSION: 1. No evidence of pulmonary embolism. 2. Ground-glass airspace opacities in both lungs, most prominent in the upper lobes, likely inflammatory or infectious. 3. Stable left axillary lymphadenopathy since the examination 2 weeks ago, much improved since August. No lymphadenopathy elsewhere.   Electronically Signed   By: Evangeline Dakin M.D.   On: 01/05/2014 20:08   Ct Angio Chest Pe W/cm &/or Wo Cm  12/23/2013   CLINICAL DATA:  Acute onset of shortness of breath, with productive cough. Initial encounter.  EXAM: CT ANGIOGRAPHY CHEST WITH CONTRAST  TECHNIQUE: Multidetector CT imaging of the chest was performed using the standard protocol during bolus administration of intravenous contrast. Multiplanar CT image reconstructions and  MIPs were obtained to evaluate  the vascular anatomy.  CONTRAST:  160mL OMNIPAQUE IOHEXOL 350 MG/ML SOLN  COMPARISON:  Chest radiograph performed earlier today at 12:46 a.m.  FINDINGS: There is no evidence of pulmonary embolus.  Bibasilar atelectasis is noted. The lungs are otherwise clear. There is no evidence of significant focal consolidation, pleural effusion or pneumothorax. No masses are identified; no abnormal focal contrast enhancement is seen.  The mediastinum is unremarkable in appearance. No mediastinal lymphadenopathy is seen. No pericardial effusion is identified. The great vessels are grossly unremarkable in appearance. A right-sided chest port is noted ending within the right atrium.  Left axillary lymphadenopathy is improved from the prior study. Previously noted enlarged nodes at the left upper quadrant are partially seen. The visualized portions of the thyroid gland are unremarkable in appearance.  The visualized portions of the liver and spleen are unremarkable.  No acute osseous abnormalities are seen.  Review of the MIP images confirms the above findings.  IMPRESSION: 1. No evidence of pulmonary embolus. 2. Bibasilar atelectasis noted; lungs otherwise clear. 3. Interval improvement in left axillary lymphadenopathy; enlarged node again noted at the left upper quadrant.   Electronically Signed   By: Garald Balding M.D.   On: 12/23/2013 04:52   Dg Chest Port 1 View  01/05/2014   CLINICAL DATA:  58 year old female with a two-day history of fever  EXAM: PORTABLE CHEST - 1 VIEW  COMPARISON:  Prior chest x-ray 12/15/2013  FINDINGS: Right IJ approach single-lumen power injectable port catheter. Catheter tip in good position in the upper right atrium. Cardiac and mediastinal contours are within normal limits. Minimal bibasilar atelectasis unchanged compared to recent prior. No new focal airspace consolidation, pulmonary edema, pleural effusion or pneumothorax. No acute osseous abnormality.   IMPRESSION: 1. Low inspiratory volumes with minimal bibasilar atelectasis. 2. No acute cardiopulmonary process. 3. Right IJ approach single-lumen power injectable port catheter with the tip in good position overlying the upper right atrium.   Electronically Signed   By: Jacqulynn Cadet M.D.   On: 01/05/2014 16:09   Dg Chest Port 1 View  12/23/2013   CLINICAL DATA:  Acute onset of shortness of breath and dyspnea. Follow-up study.  EXAM: PORTABLE CHEST - 1 VIEW  COMPARISON:  Chest radiograph performed earlier today at 12:46 a.m., and CTA of the chest performed earlier today at 4:26 a.m.  FINDINGS: The lungs are well-aerated. Mild vascular congestion is noted. Mildly increased interstitial markings are noted, new from the prior study, raising question for mild interstitial edema. No pleural effusion or pneumothorax is seen.  The cardiomediastinal silhouette is normal in size. A right-sided chest port is noted ending about the cavoatrial junction. No acute osseous abnormalities are seen.  IMPRESSION: Vascular congestion noted. Mildly interstitial markings are new from the prior study and raise question for mild acute interstitial edema.   Electronically Signed   By: Garald Balding M.D.   On: 12/23/2013 07:03   Dg Chest Port 1 View  12/23/2013   CLINICAL DATA:  Acute onset of moderate shortness of breath, with productive cough. Initial encounter.  EXAM: PORTABLE CHEST - 1 VIEW  COMPARISON:  Chest radiograph and CTA of the chest performed 09/08/2013  FINDINGS: The lungs are well-aerated. Minimal bibasilar atelectasis is noted. There is no evidence of pleural effusion or pneumothorax.  The cardiomediastinal silhouette is within normal limits. No acute osseous abnormalities are seen. A right-sided chest port is noted ending about the proximal right atrium.  IMPRESSION: Minimal bibasilar atelectasis noted; lungs otherwise clear.  Electronically Signed   By: Garald Balding M.D.   On: 12/23/2013 01:33      EKG  Interpretation None      MDM   Final diagnoses:  Hyperglycemia   Patient recently discharged for healthcare associated pneumonia. She states that she found that her blood sugar was high today. She attributes this to taking prednisone. She also missed a dose of Levemir. She states that she was advised by her home health care nurse to come to the emergency department. Will check basic labs, give fluids, and will reassess. Anticipate that the patient can be discharged back home after glucose control.  Patient discussed with Dr. Tamera Punt, who agrees with plan for discharge.  Patient is well appearing and non-toxic.  She is stable and ready for discharge.   Montine Circle, PA-C 01/13/14 1904  Malvin Johns, MD 01/13/14 [phone]

## 2014-01-16 LAB — HIV-1 INTEGRASE GENOTYPE
Date Viral Load Collected: NO GROWTH
VALUE LAST VIRAL LOAD: NO GROWTH

## 2014-01-17 ENCOUNTER — Ambulatory Visit (INDEPENDENT_AMBULATORY_CARE_PROVIDER_SITE_OTHER): Payer: Federal, State, Local not specified - PPO | Admitting: Infectious Disease

## 2014-01-17 ENCOUNTER — Encounter: Payer: Self-pay | Admitting: Infectious Disease

## 2014-01-17 VITALS — BP 105/71 | Temp 98.2°F | Wt 147.0 lb

## 2014-01-17 DIAGNOSIS — B59 Pneumocystosis: Secondary | ICD-10-CM | POA: Diagnosis not present

## 2014-01-17 DIAGNOSIS — A481 Legionnaires' disease: Secondary | ICD-10-CM | POA: Diagnosis not present

## 2014-01-17 DIAGNOSIS — B20 Human immunodeficiency virus [HIV] disease: Secondary | ICD-10-CM

## 2014-01-17 DIAGNOSIS — E1169 Type 2 diabetes mellitus with other specified complication: Secondary | ICD-10-CM

## 2014-01-17 DIAGNOSIS — C8590 Non-Hodgkin lymphoma, unspecified, unspecified site: Secondary | ICD-10-CM | POA: Diagnosis not present

## 2014-01-17 LAB — HIV-1 GENOTYPR PLUS

## 2014-01-17 MED ORDER — METFORMIN HCL 500 MG PO TABS: 500.0000 mg | ORAL_TABLET | Freq: Two times a day (BID) | ORAL | Status: AC

## 2014-01-17 MED ORDER — AZITHROMYCIN 600 MG PO TABS: 1200.0000 mg | ORAL_TABLET | ORAL | Status: DC

## 2014-01-17 MED ORDER — DAPSONE 100 MG PO TABS: 100.0000 mg | ORAL_TABLET | Freq: Every day | ORAL | Status: DC

## 2014-01-17 MED ORDER — ABACAVIR-DOLUTEGRAVIR-LAMIVUD 600-50-300 MG PO TABS: 1.0000 | ORAL_TABLET | Freq: Every day | ORAL | Status: AC

## 2014-01-17 NOTE — Progress Notes (Signed)
Subjective:    Patient ID: Bailey Moss, female    DOB: [birth year], 58 y.o.   MRN: [id number]  HPI  58 year old African American lady with NEWLY diagnosed HIV, recently diagnosed and under treatment for B cell lymphoma who had been admitted for nonresolving PNA. Her legionella ag came back positive in urine and she is being treated for legionella with levaquin. In addition she has been treated presumptively for PCP pneumonia initially with Bactrim and steroids but then developed hyperkalemia and was changed to clindamycin and primaquine. Her VL was  Lab Results  Component Value Date   HIV1RNAQUANT 500306* 01/08/2014   And her CD4 was   Lab Results  Component Value Date   CD4TABS 70* 01/07/2014   She had not yet been started on HIV meds. HIV INI was negative, HIV genotype not done.  I will start her on [place] today but drop her metformin dose down due to drug drug interaction.      Review of Systems  Constitutional: Positive for fatigue. Negative for fever, chills, diaphoresis, activity change, appetite change and unexpected weight change.  HENT: Negative for congestion, rhinorrhea, sinus pressure, sneezing, sore throat and trouble swallowing.   Eyes: Negative for photophobia and visual disturbance.  Respiratory: Positive for cough. Negative for chest tightness, shortness of breath, wheezing and stridor.   Cardiovascular: Negative for chest pain, palpitations and leg swelling.  Gastrointestinal: Negative for nausea, vomiting, abdominal pain, diarrhea, constipation, blood in stool, abdominal distention and anal bleeding.  Genitourinary: Negative for dysuria, hematuria, flank pain and difficulty urinating.  Musculoskeletal: Negative for myalgias, back pain, joint swelling, arthralgias and gait problem.  Skin: Negative for color change, pallor, rash and wound.  Neurological: Negative for dizziness, tremors, weakness and light-headedness.  Hematological: Negative for adenopathy.  Does not bruise/bleed easily.  Psychiatric/Behavioral: Negative for behavioral problems, confusion, sleep disturbance, dysphoric mood, decreased concentration and agitation.       Objective:   Physical Exam  Constitutional: She is oriented to person, place, and time. She appears well-developed and well-nourished. No distress.  HENT:  Head: Normocephalic and atraumatic.  Mouth/Throat: No oropharyngeal exudate.  Eyes: Conjunctivae and EOM are normal. No scleral icterus.  Neck: Normal range of motion. Neck supple.  Cardiovascular: Normal rate and regular rhythm.   Pulmonary/Chest: Effort normal and breath sounds normal. No respiratory distress. She has no wheezes. She has no rales. She exhibits no tenderness.  Abdominal: She exhibits no distension.  Musculoskeletal: She exhibits no edema or tenderness.  Neurological: She is alert and oriented to person, place, and time. She exhibits normal muscle tone. Coordination normal.  Skin: Skin is warm and dry. No rash noted. She is not diaphoretic. No erythema. No pallor.  Psychiatric: She has a normal mood and affect. Her behavior is normal. Judgment and thought content normal.          Assessment & Plan:   HIV/AIDS:  --start [place] daily with or without food --NO concomitant iron, magnesium or calcium supplements vitamins, ensure at same time --REDUCE DOSE OF METFORMIN due to [place] raising levels of this drug  I spent greater than 40 minutes with the patient including greater than 50% of time in face to face counsel of the patient and in coordination of their care.  Start weekly prophylaxis vs MAI with azithromycin 1200mg   ONCE done with PCP rx start dapsone 100mg  daily (and check G6PD level)   Legionella: finishing 10 day course  PCP : finish 21 day course of clindamycin  and primaquine and with steroid taper, then dapsone for prophylaxis.  B-cell lymphoma currently being followed in [place] I picked the [place] to better fit  with chemotherapeutic regimens  DM: dropping the metformin 500 mg twice daily due to drug drug interaction with Dolutegravir which elevated Metformin levels

## 2014-01-17 NOTE — Progress Notes (Signed)
Patient ID: Bailey Moss, female   DOB: 1955/12/08, 58 y.o.   MRN: [id number] HPI: Bailey Moss is a 57 y.o. female who is a newly dx HIV who is here for her first clinic visit.   Allergies: Allergies  Allergen Reactions  . Aspirin Other (See Comments)    Blood sugar goes up    Vitals: Temp: 98.2 F (36.8 C) (12/23 0942) Temp Source: Oral (12/23 0942) BP: 105/71 mmHg (12/23 1100)  Past Medical History: Past Medical History  Diagnosis Date  . Hypertension   . Type II diabetes mellitus   . Chronic bronchitis     "get it q yr" (09/08/2013)  . Pneumonia 2013; 2014; 2015  . Asthma   . Asthmatic bronchitis , chronic   . Anemia   . Stroke "~ 04/2013    "memory problems; weak all over since" (09/08/2013)  . Arthritis     "all my body"  . Memory problem   . Cancer     Social History: History   Social History  . Marital Status: Married    Spouse Name: N/A    Number of Children: N/A  . Years of Education: N/A   Social History Main Topics  . Smoking status: Never Smoker   . Smokeless tobacco: Never Used  . Alcohol Use: No  . Drug Use: No  . Sexual Activity: No   Other Topics Concern  . None   Social History Narrative    Previous Regimen: Naive  Current Regimen: None  Labs: HIV 1 RNA QUANT (copies/mL)  Date Value  01/08/2014 500306*   CD4 T CELL ABS (/uL)  Date Value  01/07/2014 70*   HEPATITIS B SURFACE AG (no units)  Date Value  01/08/2014 NEGATIVE   HCV AB (no units)  Date Value  01/08/2014 NEGATIVE    CrCl: Estimated Creatinine Clearance: 56.8 mL/min (by C-G formula based on Cr of 0.99).  Lipids: No results found for: CHOL, TRIG, HDL, CHOLHDL, VLDL, LDLCALC  Assessment: 58 pt who was recently dc from the hospital due to PCP/legionella PNA. She is a newly dx HIV pt. Her baseline CD4 is 70. For some reason a genotype was not sent except for integrase inhibitor. Her HLA is neg. We are going to start her on Triumeq today due to low suspicion  for resistance. The DTG part could really elevated the level of her metformin so we're going to half her dose of metformin. Gave her an calendar and pill box. Looks like her grand daughter is going to help her also. She is to complete her PCP treatment on 01/28/14 (21d). We are going to use dapsone for prophylaxis after that. One thing to note the grand daughter has mistakenly giving her 4 tablets of primaquine daily instead of 2. Addressed this issue also.   Recommendations:  Start Triumeq 1 PO qday Cont clinda/primaquin until 01/28/14 Start dapsone after 01/28/14  Wilfred Lacy, PharmD Clinical Infectious [facility] 01/17/2014, 11:19 AM

## 2014-01-17 NOTE — Patient Instructions (Signed)
We are going to start you on [place] for HIV, please take this medicine every day  We need to reduce your dose of metformin to 500mg  twice daily and stop the 1000mg  dose  We need you to start taking azithromycin TWO 600mg  for 1200mg  ONCE WEEKLY  We will need for you to start Dapsone AFTER you have finished your clindamycin, primaquine and prednisone  You need to followup with a Primary Care MD for control of your blood sugars and insulin requirements  We will have you come and repeat blood work here in 4 weeks and see Dr. Tommy Medal in 5 weeks

## 2014-01-23 ENCOUNTER — Other Ambulatory Visit: Payer: Self-pay | Admitting: *Deleted

## 2014-02-14 ENCOUNTER — Other Ambulatory Visit: Payer: Federal, State, Local not specified - PPO

## 2014-02-14 DIAGNOSIS — Z113 Encounter for screening for infections with a predominantly sexual mode of transmission: Secondary | ICD-10-CM

## 2014-02-14 DIAGNOSIS — B20 Human immunodeficiency virus [HIV] disease: Secondary | ICD-10-CM

## 2014-02-14 LAB — COMPLETE METABOLIC PANEL WITH GFR
ALBUMIN: 3.4 g/dL — AB (ref 3.5–5.2)
ALT: 21 U/L (ref 0–35)
AST: 28 U/L (ref 0–37)
Alkaline Phosphatase: 75 U/L (ref 39–117)
BUN: 12 mg/dL (ref 6–23)
CO2: 27 mEq/L (ref 19–32)
Calcium: 8.9 mg/dL (ref 8.4–10.5)
Chloride: 103 mEq/L (ref 96–112)
Creat: 0.92 mg/dL (ref 0.50–1.10)
GFR, Est African American: 78 mL/min
GFR, Est Non African American: 68 mL/min
Glucose, Bld: 190 mg/dL — ABNORMAL HIGH (ref 70–99)
Potassium: 3.9 mEq/L (ref 3.5–5.3)
SODIUM: 140 meq/L (ref 135–145)
Total Bilirubin: 0.3 mg/dL (ref 0.2–1.2)
Total Protein: 6.6 g/dL (ref 6.0–8.3)

## 2014-02-14 LAB — CBC WITH DIFFERENTIAL/PLATELET
BASOS ABS: 0 10*3/uL (ref 0.0–0.1)
BASOS PCT: 1 % (ref 0–1)
EOS ABS: 0.3 10*3/uL (ref 0.0–0.7)
EOS PCT: 14 % — AB (ref 0–5)
HEMATOCRIT: 28.5 % — AB (ref 36.0–46.0)
Hemoglobin: 9.5 g/dL — ABNORMAL LOW (ref 12.0–15.0)
Lymphocytes Relative: 61 % — ABNORMAL HIGH (ref 12–46)
Lymphs Abs: 1.4 10*3/uL (ref 0.7–4.0)
MCH: 32.3 pg (ref 26.0–34.0)
MCHC: 33.3 g/dL (ref 30.0–36.0)
MCV: 96.9 fL (ref 78.0–100.0)
MONO ABS: 0.3 10*3/uL (ref 0.1–1.0)
MPV: 8.5 fL — ABNORMAL LOW (ref 8.6–12.4)
Monocytes Relative: 13 % — ABNORMAL HIGH (ref 3–12)
Neutro Abs: 0.3 10*3/uL — ABNORMAL LOW (ref 1.7–7.7)
Neutrophils Relative %: 11 % — ABNORMAL LOW (ref 43–77)
PLATELETS: 189 10*3/uL (ref 150–400)
RBC: 2.94 MIL/uL — ABNORMAL LOW (ref 3.87–5.11)
RDW: 17 % — AB (ref 11.5–15.5)
WBC: 2.3 10*3/uL — ABNORMAL LOW (ref 4.0–10.5)

## 2014-02-14 LAB — RPR

## 2014-02-15 LAB — URINE CYTOLOGY ANCILLARY ONLY
CHLAMYDIA, DNA PROBE: NEGATIVE
Neisseria Gonorrhea: NEGATIVE

## 2014-02-15 LAB — T-HELPER CELL (CD4) - (RCID CLINIC ONLY)
CD4 % Helper T Cell: 20 % — ABNORMAL LOW (ref 33–55)
CD4 T CELL ABS: 280 /uL — AB (ref 400–2700)

## 2014-02-15 LAB — PATHOLOGIST SMEAR REVIEW

## 2014-02-19 LAB — HIV-1 RNA QUANT-NO REFLEX-BLD
HIV 1 RNA Quant: 398 copies/mL — ABNORMAL HIGH (ref <20)
HIV-1 RNA QUANT, LOG: 2.6 {Log} — AB (ref <1.30)

## 2014-02-21 ENCOUNTER — Encounter: Payer: Self-pay | Admitting: Infectious Disease

## 2014-02-21 ENCOUNTER — Ambulatory Visit (INDEPENDENT_AMBULATORY_CARE_PROVIDER_SITE_OTHER): Payer: Federal, State, Local not specified - PPO | Admitting: Infectious Disease

## 2014-02-21 VITALS — BP 119/76 | HR 80 | Temp 98.3°F | Wt 160.5 lb

## 2014-02-21 DIAGNOSIS — J189 Pneumonia, unspecified organism: Secondary | ICD-10-CM

## 2014-02-21 DIAGNOSIS — D72819 Decreased white blood cell count, unspecified: Secondary | ICD-10-CM

## 2014-02-21 DIAGNOSIS — B59 Pneumocystosis: Secondary | ICD-10-CM

## 2014-02-21 DIAGNOSIS — C8590 Non-Hodgkin lymphoma, unspecified, unspecified site: Secondary | ICD-10-CM

## 2014-02-21 DIAGNOSIS — A481 Legionnaires' disease: Secondary | ICD-10-CM | POA: Diagnosis not present

## 2014-02-21 DIAGNOSIS — E1169 Type 2 diabetes mellitus with other specified complication: Secondary | ICD-10-CM

## 2014-02-21 DIAGNOSIS — M069 Rheumatoid arthritis, unspecified: Secondary | ICD-10-CM

## 2014-02-21 DIAGNOSIS — A419 Sepsis, unspecified organism: Secondary | ICD-10-CM

## 2014-02-21 DIAGNOSIS — B20 Human immunodeficiency virus [HIV] disease: Secondary | ICD-10-CM

## 2014-02-21 MED ORDER — ATOVAQUONE 750 MG/5ML PO SUSP: 1500.0000 mg | Freq: Every day | ORAL | Status: DC

## 2014-02-21 NOTE — Progress Notes (Signed)
Subjective:    Patient ID: Bailey Moss, female    DOB: 1955-11-27, 59 y.o.   MRN: [id number]  Rash The current episode started 1 to 4 weeks ago. The problem has been gradually improving since onset. The affected locations include the scalp. Associated symptoms include fatigue. Pertinent negatives include no congestion, diarrhea, fever, rhinorrhea, shortness of breath, sore throat or vomiting. Past treatments include topical steroids. The treatment provided mild relief. Her past medical history is significant for allergies.    59 year old [place] American lady with NEWLY diagnosed HIV, recently diagnosed and under treatment for B cell lymphoma who had been admitted for nonresolving PNA. Her legionella ag came back positive in urine and she is being treated for legionella with levaquin. In addition she has been treated presumptively for PCP pneumonia initially with Bactrim and steroids but then developed hyperkalemia and was changed to clindamycin and primaquine.   He has finished her course of clindamycin primaquine and and steroids. She is now on prophylactic dapsone.  She has been on [place] for approximately 3 weeks prior to her most recent blood draw and is dropped her viral load from the  approximate 500,000 down to 398 copies per milliliter of blood. Her CD4 count has more than quadrupled going from 70-280.  She does have some worsening leukopenia and is concerned about this cord of the patient and her daughter she has not an undergoing chemotherapy recently has not been taking her methotrexate. Her anemia is fairly stable. I noted we not did not check a G6PD level prior to her leaving the hospital and being on primaquine and then now being on dapsone.  She claims to only be taking getting 500 mg of metformin twice daily I noted interaction with the dolutegravir and metformin (it raises metfomin levels).  He had worsening of her rash on her hands and face after applying apparently the  wrong "cream and is now getting some improvement with over-the-counter corticosteroids.   Lab Results  Component Value Date   HIV1RNAQUANT 398* 02/14/2014   And her CD4 was   Lab Results  Component Value Date   CD4TABS 280* 02/14/2014   CD4TABS 70* 01/07/2014      Review of Systems  Constitutional: Positive for fatigue. Negative for fever, chills, diaphoresis, activity change, appetite change and unexpected weight change.  HENT: Negative for congestion, rhinorrhea, sinus pressure, sneezing, sore throat and trouble swallowing.   Eyes: Negative for photophobia and visual disturbance.  Respiratory: Negative for chest tightness, shortness of breath, wheezing and stridor.   Cardiovascular: Negative for chest pain, palpitations and leg swelling.  Gastrointestinal: Negative for nausea, vomiting, abdominal pain, diarrhea, constipation, blood in stool, abdominal distention and anal bleeding.  Genitourinary: Negative for dysuria, hematuria, flank pain and difficulty urinating.  Musculoskeletal: Negative for myalgias, back pain, joint swelling, arthralgias and gait problem.  Skin: Positive for rash. Negative for color change, pallor and wound.  Neurological: Negative for dizziness, tremors, weakness and light-headedness.  Hematological: Negative for adenopathy. Does not bruise/bleed easily.  Psychiatric/Behavioral: Negative for behavioral problems, confusion, sleep disturbance, dysphoric mood, decreased concentration and agitation.       Objective:   Physical Exam  Constitutional: She is oriented to person, place, and time. She appears well-developed and well-nourished. No distress.  HENT:  Head: Normocephalic and atraumatic.  Mouth/Throat: No oropharyngeal exudate.  Eyes: Conjunctivae and EOM are normal. No scleral icterus.  Neck: Normal range of motion. Neck supple.  Cardiovascular: Normal rate and regular rhythm.  Pulmonary/Chest: Effort normal and breath sounds normal. No  respiratory distress. She has no wheezes. She has no rales. She exhibits no tenderness.  Abdominal: She exhibits no distension.  Musculoskeletal: She exhibits no edema or tenderness.  Neurological: She is alert and oriented to person, place, and time. She exhibits normal muscle tone. Coordination normal.  Skin: Skin is warm and dry. No rash noted. She is not diaphoretic. No erythema. No pallor.  Psychiatric: She has a normal mood and affect. Her behavior is normal. Judgment and thought content normal.          Assessment & Plan:   HIV/AIDS:  --continue  TRIUMEQ daily with or without food --NO concomitant iron, magnesium or calcium supplements vitamins, ensure at same time --REDUCE DOSE OF METFORMIN due to [place] raising levels of this drug  I spent greater than 40 minutes with the patient including greater than 50% of time in face to face counsel of the patient and in coordination of their care.  DC  azithromycin 1200mg   Concern potentially to the dapsone might be a culprit in her leukopenia and anemia although more known for causing hemolytic anemia. We'll recheck her CBC with differential today check an LDH and a haptoglobin and a G6PD I will change her to [facility] for PCP prophylaxis. Get rid of this next 3 months provider CD4 count stays high.  Leukopenia: Puzzled why this is now an issue potentially the steroids may have been masking this and maybe it might be related to prior low white blood cell count in the setting of chemotherapy but has not yet recovered. She denies taking methotrexate as above we will stop her dapsone and change her over to atovaquone. Her TRIUMEQ should have actually nothing to do with this problem  NHL: getting chemotherapy and row note. When his reinitiated there is risk of further dropping of her white blood cell count and her CD4 count and this may make it imperative that we continue PCP prophylaxis for longer   Legionella:finished 10 day course  PCP :  finished  21 day course of clindamycin and primaquine and with steroid taper, then dapsone now to mepron   DM: dropping the metformin 500 mg twice daily due to drug drug interaction with Dolutegravir which elevated Metformin levels  RA: claims not to be taking methotrexate

## 2014-02-21 NOTE — Patient Instructions (Signed)
We will get blood work today to double check what is going on with your white blood count, and anemia  STOP THE DAPSONE and the AZITHROMYCIN  We will start MEPRON 1500MG  PER [place]

## 2014-02-22 LAB — HIV-1 RNA QUANT-NO REFLEX-BLD
HIV 1 RNA Quant: 211 copies/mL — ABNORMAL HIGH (ref <20)
HIV-1 RNA Quant, Log: 2.32 {Log} — ABNORMAL HIGH (ref <1.30)

## 2014-02-22 LAB — CBC WITH DIFFERENTIAL/PLATELET
Basophils Absolute: 0 10*3/uL (ref 0.0–0.1)
Basophils Relative: 0 % (ref 0–1)
EOS ABS: 0.8 10*3/uL — AB (ref 0.0–0.7)
EOS PCT: 25 % — AB (ref 0–5)
HEMATOCRIT: 30.4 % — AB (ref 36.0–46.0)
Hemoglobin: 10.2 g/dL — ABNORMAL LOW (ref 12.0–15.0)
LYMPHS PCT: 45 % (ref 12–46)
Lymphs Abs: 1.5 10*3/uL (ref 0.7–4.0)
MCH: 32.9 pg (ref 26.0–34.0)
MCHC: 33.6 g/dL (ref 30.0–36.0)
MCV: 98.1 fL (ref 78.0–100.0)
MONOS PCT: 23 % — AB (ref 3–12)
MPV: 8.7 fL (ref 8.6–12.4)
Monocytes Absolute: 0.8 10*3/uL (ref 0.1–1.0)
NEUTROS PCT: 7 % — AB (ref 43–77)
Neutro Abs: 0.2 10*3/uL — ABNORMAL LOW (ref 1.7–7.7)
PLATELETS: 202 10*3/uL (ref 150–400)
RBC: 3.1 MIL/uL — ABNORMAL LOW (ref 3.87–5.11)
RDW: 16.5 % — AB (ref 11.5–15.5)
WBC: 3.3 10*3/uL — AB (ref 4.0–10.5)

## 2014-02-22 LAB — LACTATE DEHYDROGENASE, ISOENZYMES
LD1/LD2 Ratio: 0.47
LDH 1: 14 % — ABNORMAL LOW (ref 19–38)
LDH 2: 30 % (ref 30–43)
LDH 3: 29 % — AB (ref 16–26)
LDH 4: 13 % — AB (ref 3–12)
LDH 5: 14 % (ref 3–14)
LDH Isoenzymes, Total: 320 U/L — ABNORMAL HIGH (ref 120–250)

## 2014-02-22 LAB — GLUCOSE 6 PHOSPHATE DEHYDROGENASE: G-6PDH: 9.6 U/g Hgb (ref 7.0–20.5)

## 2014-02-22 LAB — HAPTOGLOBIN: Haptoglobin: 145 mg/dL (ref 43–212)

## 2014-03-01 ENCOUNTER — Telehealth: Payer: Self-pay | Admitting: *Deleted

## 2014-03-01 NOTE — Telephone Encounter (Signed)
Only OTC corticosteroids have been used.

## 2014-03-01 NOTE — Telephone Encounter (Signed)
Has she only tried otc cream?

## 2014-03-01 NOTE — Telephone Encounter (Signed)
Pt reports no improvement in facial rash with cream.  Wanting to know if there is anything else that can be used on the rash.

## 2014-03-02 NOTE — Telephone Encounter (Signed)
Requested pt call back about facial rash.  Dr. Lucianne Lei Dam's response and order "She can have triamcinolone .05% ointment to the face and she should come back to clinic for re-examination. I assume she has no insurance and no ability see a dermatologist."  Need to find out which pharmacy she wants to use and more detailed prescription directions from Dr. Tommy Medal.

## 2014-03-05 ENCOUNTER — Other Ambulatory Visit: Payer: Self-pay | Admitting: *Deleted

## 2014-03-05 ENCOUNTER — Other Ambulatory Visit: Payer: Self-pay | Admitting: Infectious Disease

## 2014-03-05 DIAGNOSIS — R21 Rash and other nonspecific skin eruption: Secondary | ICD-10-CM

## 2014-03-05 MED ORDER — TRIAMCINOLONE ACETONIDE 0.05 % EX OINT: 1.0000 "application " | TOPICAL_OINTMENT | Freq: Two times a day (BID) | CUTANEOUS | Status: DC

## 2014-03-05 NOTE — Telephone Encounter (Signed)
I put in orders on this last week  Ibelieve. Just following up loose ends

## 2014-03-07 ENCOUNTER — Other Ambulatory Visit: Payer: Self-pay | Admitting: *Deleted

## 2014-03-07 DIAGNOSIS — R21 Rash and other nonspecific skin eruption: Secondary | ICD-10-CM

## 2014-03-07 MED ORDER — TRIAMCINOLONE ACETONIDE 0.05 % EX OINT: 1.0000 "application " | TOPICAL_OINTMENT | Freq: Two times a day (BID) | CUTANEOUS | Status: AC

## 2014-04-09 ENCOUNTER — Encounter: Payer: Self-pay | Admitting: Infectious Disease

## 2014-04-13 ENCOUNTER — Telehealth: Payer: Self-pay | Admitting: Licensed Clinical Social Worker

## 2014-04-13 NOTE — Telephone Encounter (Signed)
She needs to be seen in clinic asap if not she will need to come to the ED   IS she still receiving chemotherapy?

## 2014-04-13 NOTE — Telephone Encounter (Signed)
Daughter called back for MD response.  Pt has not been on chemotherapy this month.  Pt scheduled for appt on Tuesday, March 22 w/ Dr. Tommy Medal per his recommendation.  RN advised daughter to offer her mother a soft diet this weekend to see how she responds.  Daughter verbalized understanding.

## 2014-04-13 NOTE — Telephone Encounter (Signed)
Patient having diarrhea for 3 weeks, Imodium is not working. Patient states that she has stomach cramps right before. Patient states that she is not on antibiotics. Please advise

## 2014-04-17 ENCOUNTER — Ambulatory Visit (INDEPENDENT_AMBULATORY_CARE_PROVIDER_SITE_OTHER): Payer: Federal, State, Local not specified - PPO | Admitting: Infectious Disease

## 2014-04-17 ENCOUNTER — Encounter: Payer: Self-pay | Admitting: Infectious Disease

## 2014-04-17 VITALS — BP 131/82 | HR 102 | Temp 97.5°F | Ht 64.0 in | Wt 156.0 lb

## 2014-04-17 DIAGNOSIS — B20 Human immunodeficiency virus [HIV] disease: Secondary | ICD-10-CM | POA: Diagnosis not present

## 2014-04-17 DIAGNOSIS — D72819 Decreased white blood cell count, unspecified: Secondary | ICD-10-CM | POA: Diagnosis not present

## 2014-04-17 DIAGNOSIS — M069 Rheumatoid arthritis, unspecified: Secondary | ICD-10-CM | POA: Diagnosis not present

## 2014-04-17 DIAGNOSIS — H209 Unspecified iridocyclitis: Secondary | ICD-10-CM

## 2014-04-17 DIAGNOSIS — J302 Other seasonal allergic rhinitis: Secondary | ICD-10-CM | POA: Insufficient documentation

## 2014-04-17 DIAGNOSIS — A481 Legionnaires' disease: Secondary | ICD-10-CM | POA: Diagnosis not present

## 2014-04-17 DIAGNOSIS — C8590 Non-Hodgkin lymphoma, unspecified, unspecified site: Secondary | ICD-10-CM

## 2014-04-17 LAB — COMPLETE METABOLIC PANEL WITH GFR
ALK PHOS: 315 U/L — AB (ref 39–117)
ALT: 138 U/L — AB (ref 0–35)
AST: 91 U/L — ABNORMAL HIGH (ref 0–37)
Albumin: 4.1 g/dL (ref 3.5–5.2)
BUN: 8 mg/dL (ref 6–23)
CHLORIDE: 109 meq/L (ref 96–112)
CO2: 28 mEq/L (ref 19–32)
CREATININE: 0.69 mg/dL (ref 0.50–1.10)
Calcium: 9.1 mg/dL (ref 8.4–10.5)
GFR, Est African American: >89 mL/min
GFR, Est Non African American: >89 mL/min
Glucose, Bld: 86 mg/dL (ref 70–99)
POTASSIUM: 3.9 meq/L (ref 3.5–5.3)
Sodium: 143 mEq/L (ref 135–145)
Total Bilirubin: 0.6 mg/dL (ref 0.2–1.2)
Total Protein: 6.6 g/dL (ref 6.0–8.3)

## 2014-04-17 NOTE — Progress Notes (Signed)
Subjective:    Patient ID: Bailey Moss, female    DOB: 06/08/1955, 59 y.o.   MRN: [id number]  HPI  59 year old African American lady with recently  diagnosed HIV, recently diagnosed and under treatment for B cell lymphoma who had been admitted for nonresolving PNA. Her legionella ag came back positive in urine and she is being treated for legionella with levaquin. In addition she has been treated presumptively for PCP pneumonia initially with Bactrim and steroids but then developed hyperkalemia and was changed to clindamycin and primaquine and finished a course of this  She has been on TRIUMEQ dropped her viral load from the  approximate 500,000 down to 398 copies per milliliter of blood. Her CD4 count has more than quadrupled going from 70-280. Insulin viral load however did not complete suppressed.  Lab Results  Component Value Date   HIV1RNAQUANT 211* 02/21/2014   Lab Results  Component Value Date   CD4TABS 280* 02/14/2014   CD4TABS 70* 01/07/2014    Since we last saw her she had been having trouble with severe diarrhea having multiple bowel movements per day. This improved with her having his continued her azithromycin on her own and taking over-the-counter Imodium. She is now having loose to loose bowel movements per day.  I told her it was okay to stop azithromycin given the fact that her CD4 count should still be quite high if she has been compliant with her antiretroviral medicines.     Review of Systems  Constitutional: Negative for chills, diaphoresis, activity change, appetite change and unexpected weight change.  HENT: Negative for sinus pressure, sneezing and trouble swallowing.   Eyes: Negative for photophobia and visual disturbance.  Respiratory: Negative for chest tightness, wheezing and stridor.   Cardiovascular: Negative for chest pain, palpitations and leg swelling.  Gastrointestinal: Positive for diarrhea. Negative for nausea, abdominal pain, constipation,  blood in stool, abdominal distention and anal bleeding.  Genitourinary: Negative for dysuria, hematuria, flank pain and difficulty urinating.  Musculoskeletal: Negative for myalgias, back pain, joint swelling, arthralgias and gait problem.  Skin: Negative for color change, pallor and wound.  Neurological: Negative for dizziness, tremors, weakness and light-headedness.  Hematological: Negative for adenopathy. Does not bruise/bleed easily.  Psychiatric/Behavioral: Negative for behavioral problems, confusion, sleep disturbance, dysphoric mood, decreased concentration and agitation.       Objective:   Physical Exam  Constitutional: She is oriented to person, place, and time. She appears well-developed and well-nourished. No distress.  HENT:  Head: Normocephalic and atraumatic.  Mouth/Throat: No oropharyngeal exudate.  Eyes: Conjunctivae and EOM are normal. No scleral icterus.  Neck: Normal range of motion. Neck supple.  Cardiovascular: Normal rate and regular rhythm.   Pulmonary/Chest: Effort normal and breath sounds normal. No respiratory distress. She has no wheezes. She has no rales. She exhibits no tenderness.  Abdominal: She exhibits no distension.  Musculoskeletal: She exhibits no edema or tenderness.  Neurological: She is alert and oriented to person, place, and time. She exhibits normal muscle tone. Coordination normal.  Skin: Skin is warm and dry. No rash noted. She is not diaphoretic. No erythema. No pallor.  Psychiatric: She has a normal mood and affect. Her behavior is normal. Judgment and thought content normal.          Assessment & Plan:   HIV/AIDS:  --continue  TRIUMEQ daily with or without food --NO concomitant iron, magnesium or calcium supplements vitamins, ensure at same time --REDUCED DOSE OF METFORMIN due to [place] raising levels  of this drug  Check labs today if CD4 count is still above 200 discontinue her Mepron  I spent greater than 25 minutes with the  patient including greater than 50% of time in face to face counsel of the patient and in coordination of their care. We had extensive discussion around the nature of HIV and intervertebral medications and need for height here is with her [place].  Note she is also still seeing Bailey Moss with [facility].  She should really establish care with just one clinic rather than duplicating visits      Leukopenia and neutropenia:  Recheck labs today.  NHL: She denies being on chemotherapy at this point in time. Would like to get records from her hematologist , per [place] has not established care with "[facility]" oncology. May want to get her in with [facility] here in [place] instead if not addressed promptly  I will make sure with her and her daughter. Much of this information is discovered through care everywhere after her visit.   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 Would like her seen by optho

## 2014-04-18 LAB — CBC WITH DIFFERENTIAL/PLATELET
Basophils Absolute: 0 10*3/uL (ref 0.0–0.1)
Basophils Relative: 1 % (ref 0–1)
Eosinophils Absolute: 0.6 10*3/uL (ref 0.0–0.7)
Eosinophils Relative: 29 % — ABNORMAL HIGH (ref 0–5)
HEMATOCRIT: 33.4 % — AB (ref 36.0–46.0)
HEMOGLOBIN: 11.6 g/dL — AB (ref 12.0–15.0)
Lymphocytes Relative: 13 % (ref 12–46)
Lymphs Abs: 0.3 10*3/uL — ABNORMAL LOW (ref 0.7–4.0)
MCH: 32 pg (ref 26.0–34.0)
MCHC: 34.7 g/dL (ref 30.0–36.0)
MCV: 92.3 fL (ref 78.0–100.0)
MONOS PCT: 12 % (ref 3–12)
MPV: 9.2 fL (ref 8.6–12.4)
Monocytes Absolute: 0.2 10*3/uL (ref 0.1–1.0)
NEUTROS ABS: 0.9 10*3/uL — AB (ref 1.7–7.7)
Neutrophils Relative %: 45 % (ref 43–77)
Platelets: 163 10*3/uL (ref 150–400)
RBC: 3.62 MIL/uL — AB (ref 3.87–5.11)
RDW: 14.5 % (ref 11.5–15.5)
WBC: 2 10*3/uL — ABNORMAL LOW (ref 4.0–10.5)

## 2014-04-18 LAB — URINE CYTOLOGY ANCILLARY ONLY
CHLAMYDIA, DNA PROBE: NEGATIVE
NEISSERIA GONORRHEA: NEGATIVE

## 2014-04-18 LAB — RPR

## 2014-04-18 LAB — T-HELPER CELL (CD4) - (RCID CLINIC ONLY)
CD4 % Helper T Cell: 25 % — ABNORMAL LOW (ref 33–55)
CD4 T Cell Abs: 70 /uL — ABNORMAL LOW (ref 400–2700)

## 2014-04-19 ENCOUNTER — Telehealth: Payer: Self-pay | Admitting: Licensed Clinical Social Worker

## 2014-04-19 ENCOUNTER — Telehealth: Payer: Self-pay | Admitting: *Deleted

## 2014-04-19 ENCOUNTER — Other Ambulatory Visit: Payer: Self-pay | Admitting: Licensed Clinical Social Worker

## 2014-04-19 DIAGNOSIS — B20 Human immunodeficiency virus [HIV] disease: Secondary | ICD-10-CM

## 2014-04-19 LAB — HIV-1 RNA ULTRAQUANT REFLEX TO GENTYP+
HIV 1 RNA Quant: <20 copies/mL (ref <20)
HIV-1 RNA Quant, Log: <1.3 {Log} (ref <1.30)

## 2014-04-19 MED ORDER — AZITHROMYCIN 600 MG PO TABS: 1200.0000 mg | ORAL_TABLET | ORAL | Status: AC

## 2014-04-19 NOTE — Telephone Encounter (Signed)
error 

## 2014-04-19 NOTE — Telephone Encounter (Signed)
Very goodl she will need contiued M avium prophylaxis with azithromycin and PCP prophylaxis with oral mepron until she has completed her chemo and recovered her CD4 count

## 2014-04-19 NOTE — Telephone Encounter (Signed)
We need to find out if she is getting chemo because her profound and sudden leukopenia is not due to HIV since he is suppressed

## 2014-04-19 NOTE — Telephone Encounter (Signed)
-----   Message from Truman Hayward, MD sent at 04/18/2014 10:49 PM EDT ----- I need mrs Bailey Moss to come back to see [place] in next 2-3 weeks. She will also have to restart he weekly azithromycin

## 2014-04-19 NOTE — Telephone Encounter (Signed)
-----   Message from Truman Hayward, MD sent at 04/18/2014 10:49 PM EDT ----- I need mrs Winning to come back to see [place] in next 2-3 weeks. She will also have to restart he weekly azithromycin

## 2014-04-19 NOTE — Telephone Encounter (Signed)
Left message with follow up appointment (4/6, 9am) and instructions to restart azithromycin.  Reordered from her previous dose of Azithromycin 1200mg  once a week.  MD, please cosign the medication. Landis Gandy, RN

## 2014-04-19 NOTE — Telephone Encounter (Signed)
Spoke with patient's daughter.  She has restarted chemo, but had to hold last month's treatment due to her blood counts.  She is in touch with her oncologist in [place].  Patient's daughter verbalized understanding of appointments, medication.

## 2014-04-19 NOTE — Telephone Encounter (Signed)
I asked patient to call back and schedule an appointment for 2 to 3 weeks. I also asked her to continue her azithromycin

## 2014-04-19 NOTE — Telephone Encounter (Signed)
-----   Message from Truman Hayward, MD sent at 04/18/2014 10:49 PM EDT ----- I need mrs Mcerlean to come back to see [place] in next 2-3 weeks. She will also have to restart he weekly azithromycin

## 2014-04-23 ENCOUNTER — Other Ambulatory Visit: Payer: Self-pay | Admitting: *Deleted

## 2014-04-23 DIAGNOSIS — B20 Human immunodeficiency virus [HIV] disease: Secondary | ICD-10-CM

## 2014-04-23 MED ORDER — ATOVAQUONE 750 MG/5ML PO SUSP: 1500.0000 mg | Freq: Every day | ORAL | Status: AC

## 2014-04-23 NOTE — Telephone Encounter (Signed)
Thanks Michelle

## 2014-04-23 NOTE — Telephone Encounter (Signed)
Sent refills of mepron too. Thanks.

## 2014-04-25 ENCOUNTER — Other Ambulatory Visit: Payer: Self-pay | Admitting: Internal Medicine

## 2014-04-25 LAB — HIV-1 INTEGRASE GENOTYPE

## 2014-04-26 ENCOUNTER — Telehealth: Payer: Self-pay | Admitting: Infectious Disease

## 2014-04-26 NOTE — Telephone Encounter (Signed)
Patient and daughter called and advised that the patient takes her medication daily and she just picked up new RX last week not sure what day but daughter said she picked it up for the patient.

## 2014-04-26 NOTE — Telephone Encounter (Signed)
We should call her daughter actually the patient's number is not reliable. I forgot to mention this

## 2014-04-26 NOTE — Telephone Encounter (Signed)
Attempted to call patient to enquire about this and there is no answer and her mail box is full. Bailey Moss

## 2014-04-26 NOTE — Telephone Encounter (Signed)
I received alert from [place] that she had last filled her [place] on 03/14/14  Can we make sure she has TRIUMEQ and not missing ANY doses

## 2014-05-02 ENCOUNTER — Ambulatory Visit: Payer: Federal, State, Local not specified - PPO | Admitting: Infectious Disease

## 2014-05-23 ENCOUNTER — Ambulatory Visit: Payer: Federal, State, Local not specified - PPO | Admitting: Infectious Disease

## 2014-06-18 ENCOUNTER — Ambulatory Visit: Payer: Federal, State, Local not specified - PPO | Admitting: Infectious Disease

## 2015-03-25 IMAGING — CR DG CHEST 1V PORT
1 series · 1 of 1 positions shown · non-contrast
Comparison: Prior chest x-ray 12/15/2013

CLINICAL DATA: 58-year-old female with a two-day history of fever

EXAM:
PORTABLE CHEST - 1 VIEW

[AP]
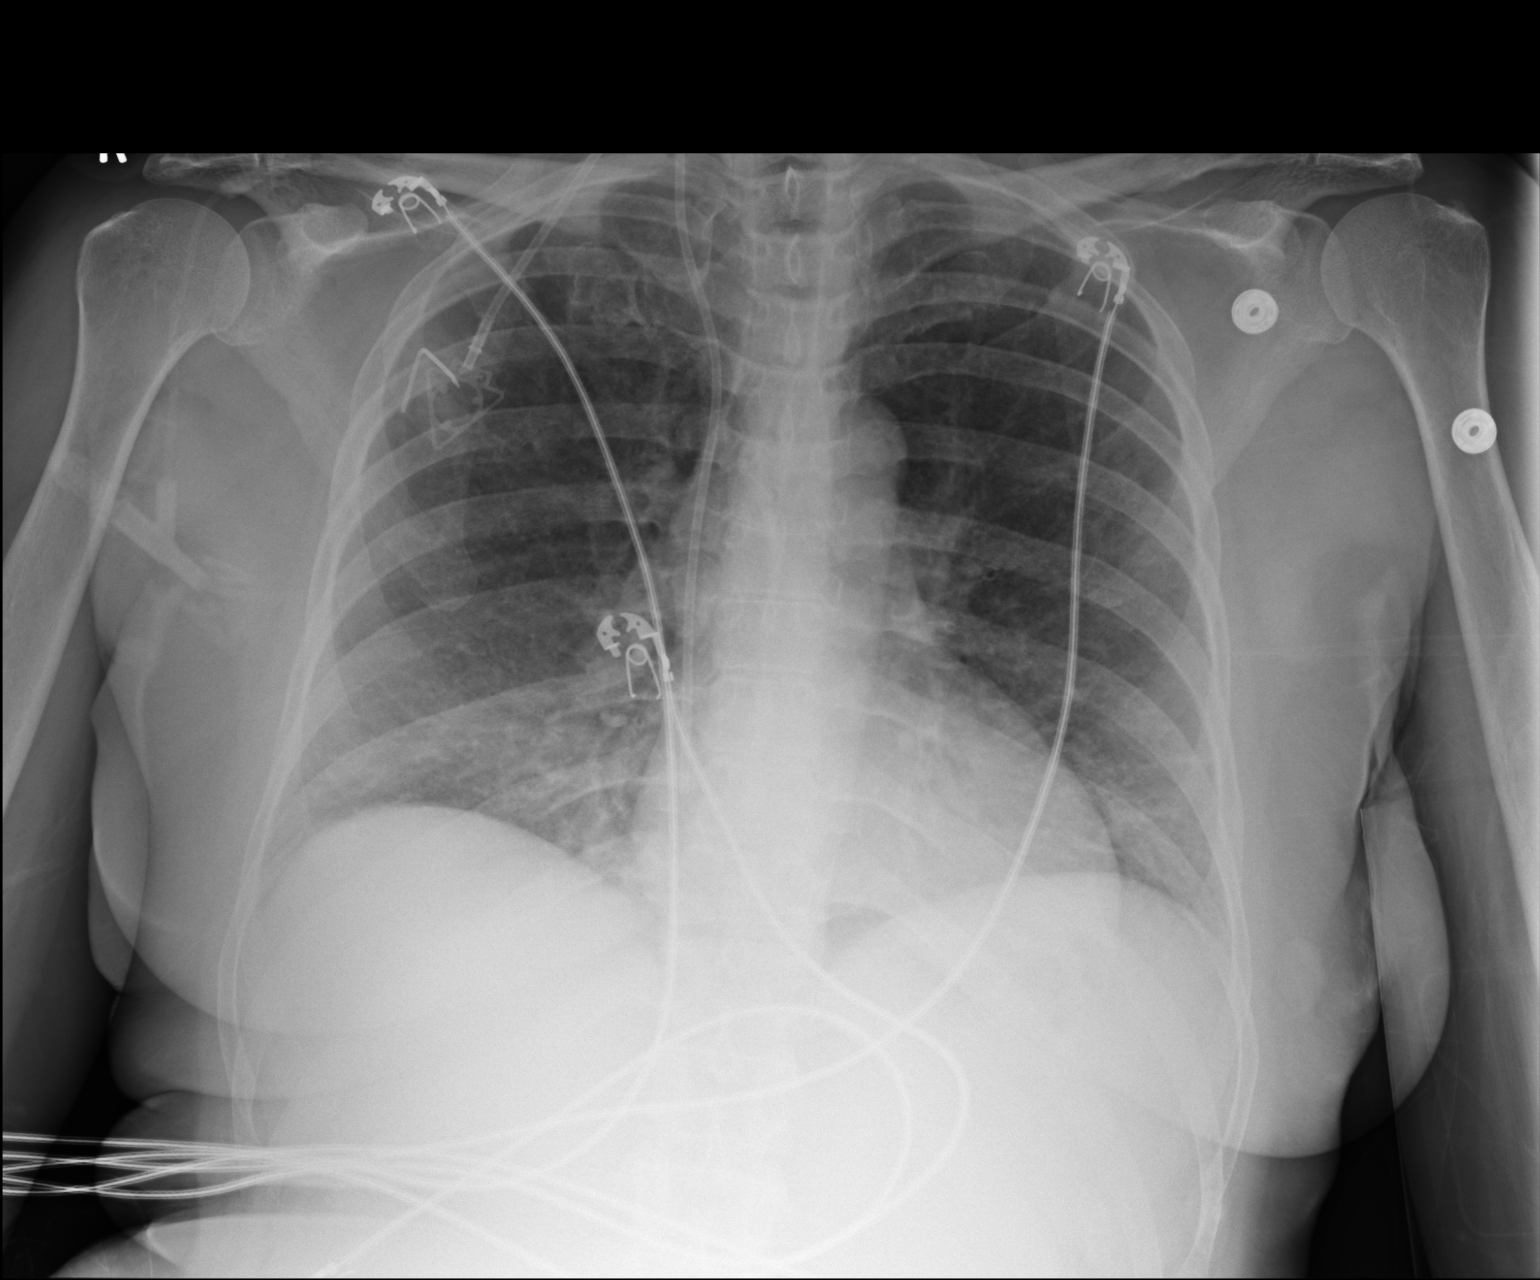

[1 of 1 positions shown; findings below may reference images not displayed]

FINDINGS: Right IJ approach single-lumen power injectable port catheter.
Catheter tip in good position in the upper right atrium. Cardiac and
mediastinal contours are within normal limits. Minimal bibasilar
atelectasis unchanged compared to recent prior. No new focal
airspace consolidation, pulmonary edema, pleural effusion or
pneumothorax. No acute osseous abnormality.
IMPRESSION: 1. Low inspiratory volumes with minimal bibasilar atelectasis.
2. No acute cardiopulmonary process.
3. Right IJ approach single-lumen power injectable port catheter
with the tip in good position overlying the upper right atrium.
